# Patient Record
Sex: Female | Born: 1970 | Race: Black or African American | Hispanic: No | Marital: Married | Smoking: Never smoker
Health system: Southern US, Community
[De-identification: ages and names within clinical notes are randomized; demographics above are authoritative.]

## PROBLEM LIST (undated history)

## (undated) DIAGNOSIS — R140 Abdominal distension (gaseous): Secondary | ICD-10-CM

## (undated) DIAGNOSIS — N951 Menopausal and female climacteric states: Secondary | ICD-10-CM

## (undated) DIAGNOSIS — N898 Other specified noninflammatory disorders of vagina: Secondary | ICD-10-CM

## (undated) DIAGNOSIS — R1031 Right lower quadrant pain: Secondary | ICD-10-CM

## (undated) DIAGNOSIS — K219 Gastro-esophageal reflux disease without esophagitis: Secondary | ICD-10-CM

## (undated) DIAGNOSIS — D219 Benign neoplasm of connective and other soft tissue, unspecified: Secondary | ICD-10-CM

## (undated) DIAGNOSIS — K5900 Constipation, unspecified: Secondary | ICD-10-CM

## (undated) DIAGNOSIS — N926 Irregular menstruation, unspecified: Principal | ICD-10-CM

## (undated) DIAGNOSIS — R319 Hematuria, unspecified: Secondary | ICD-10-CM

## (undated) DIAGNOSIS — E041 Nontoxic single thyroid nodule: Secondary | ICD-10-CM

## (undated) DIAGNOSIS — A5901 Trichomonal vulvovaginitis: Secondary | ICD-10-CM

## (undated) DIAGNOSIS — R1032 Left lower quadrant pain: Secondary | ICD-10-CM

## (undated) DIAGNOSIS — R232 Flushing: Secondary | ICD-10-CM

## (undated) HISTORY — DX: Hematuria, unspecified: R31.9

## (undated) HISTORY — DX: Abdominal distension (gaseous): R14.0

## (undated) HISTORY — DX: Other specified noninflammatory disorders of vagina: N89.8

## (undated) HISTORY — DX: Menopausal and female climacteric states: N95.1

## (undated) HISTORY — PX: WISDOM TOOTH EXTRACTION: SHX21

## (undated) HISTORY — DX: Gastro-esophageal reflux disease without esophagitis: K21.9

## (undated) HISTORY — DX: Benign neoplasm of connective and other soft tissue, unspecified: D21.9

## (undated) HISTORY — DX: Right lower quadrant pain: R10.31

## (undated) HISTORY — DX: Nontoxic single thyroid nodule: E04.1

## (undated) HISTORY — DX: Trichomonal vulvovaginitis: A59.01

## (undated) HISTORY — DX: Left lower quadrant pain: R10.32

## (undated) HISTORY — DX: Flushing: R23.2

## (undated) HISTORY — DX: Constipation, unspecified: K59.00

## (undated) HISTORY — DX: Irregular menstruation, unspecified: N92.6

---

## 2005-02-26 ENCOUNTER — Ambulatory Visit (HOSPITAL_COMMUNITY): Admission: RE | Admit: 2005-02-26 | Discharge: 2005-02-26 | Payer: Self-pay | Admitting: Gastroenterology

## 2008-02-13 ENCOUNTER — Other Ambulatory Visit: Admission: RE | Admit: 2008-02-13 | Discharge: 2008-02-13 | Payer: Self-pay | Admitting: Obstetrics and Gynecology

## 2009-01-25 ENCOUNTER — Ambulatory Visit (HOSPITAL_COMMUNITY): Admission: RE | Admit: 2009-01-25 | Discharge: 2009-01-25 | Payer: Self-pay | Admitting: Family Medicine

## 2009-01-29 ENCOUNTER — Encounter (HOSPITAL_COMMUNITY): Admission: RE | Admit: 2009-01-29 | Discharge: 2009-02-28 | Payer: Self-pay | Admitting: Family Medicine

## 2009-02-28 HISTORY — PX: ESOPHAGOGASTRODUODENOSCOPY: SHX1529

## 2009-03-04 ENCOUNTER — Other Ambulatory Visit: Admission: RE | Admit: 2009-03-04 | Discharge: 2009-03-04 | Payer: Self-pay | Admitting: Obstetrics and Gynecology

## 2009-03-13 ENCOUNTER — Encounter (HOSPITAL_COMMUNITY): Admission: RE | Admit: 2009-03-13 | Discharge: 2009-04-12 | Payer: Self-pay | Admitting: Internal Medicine

## 2010-03-05 ENCOUNTER — Other Ambulatory Visit: Admission: RE | Admit: 2010-03-05 | Discharge: 2010-03-05 | Payer: Self-pay | Admitting: Obstetrics and Gynecology

## 2010-10-22 IMAGING — US US ABDOMEN COMPLETE
1 series · 14 of 25 positions shown · non-contrast
Comparison: None

CLINICAL DATA: Right flank and subscapular pain, bloating

ABDOMEN ULTRASOUND
TECHNIQUE: Complete abdominal ultrasound examination was performed
including evaluation of the liver, gallbladder, bile ducts,
pancreas, kidneys, spleen, IVC, and abdominal aorta.

[Series 1: unknown · 0.25mm/px · 14 of 67 slices shown]
[im 1/67]
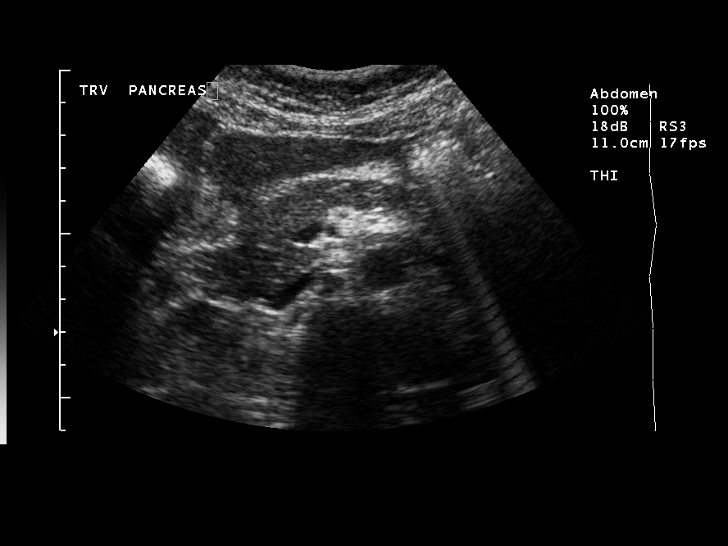
[im 6/67]
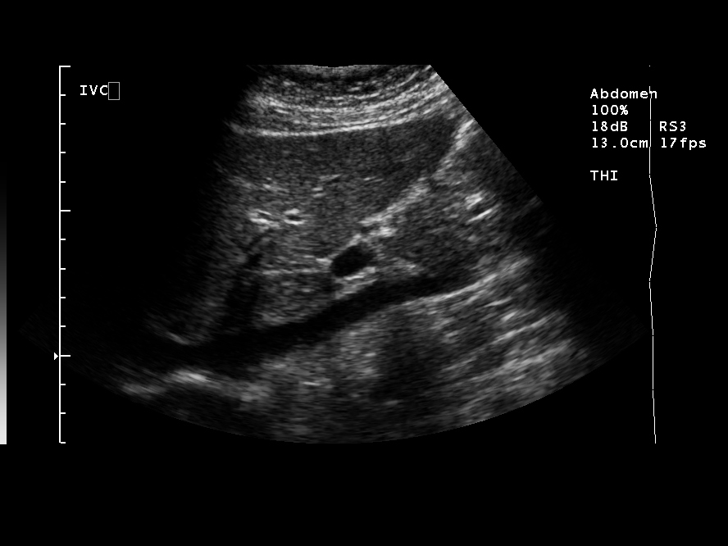
[im 12/67]
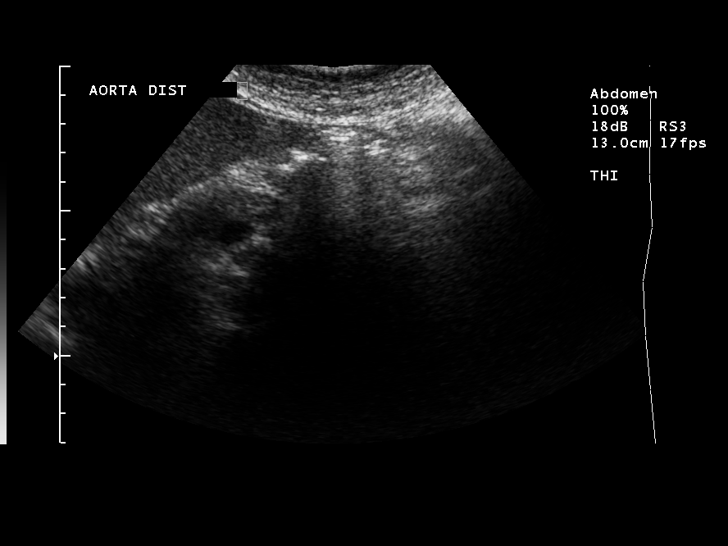
[im 17/67]
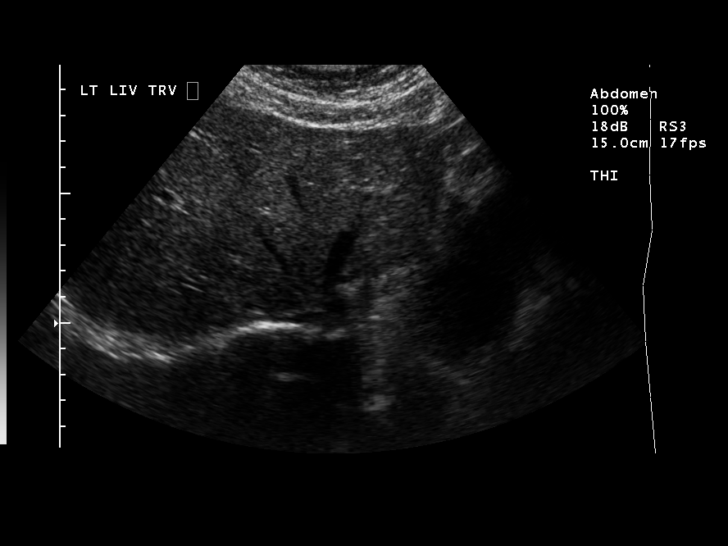
[im 23/67]
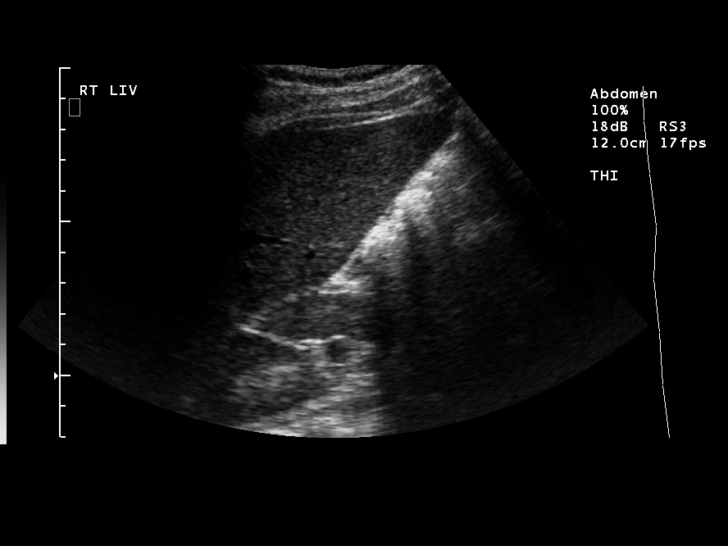
[im 25/67]
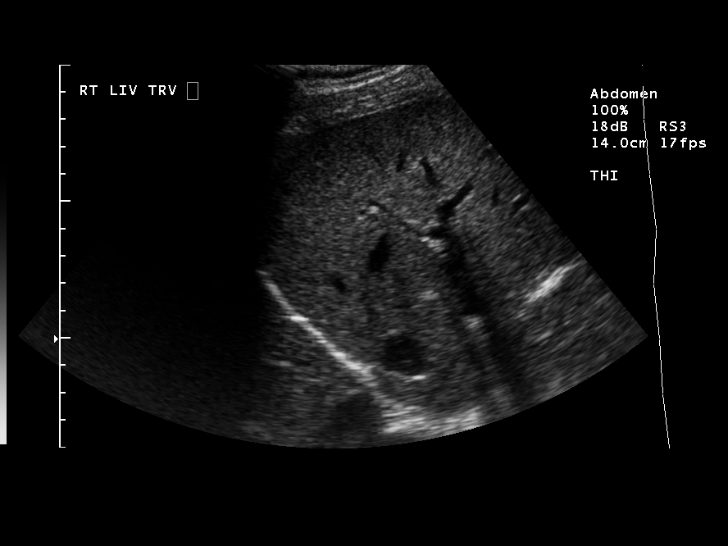
[im 31/67]
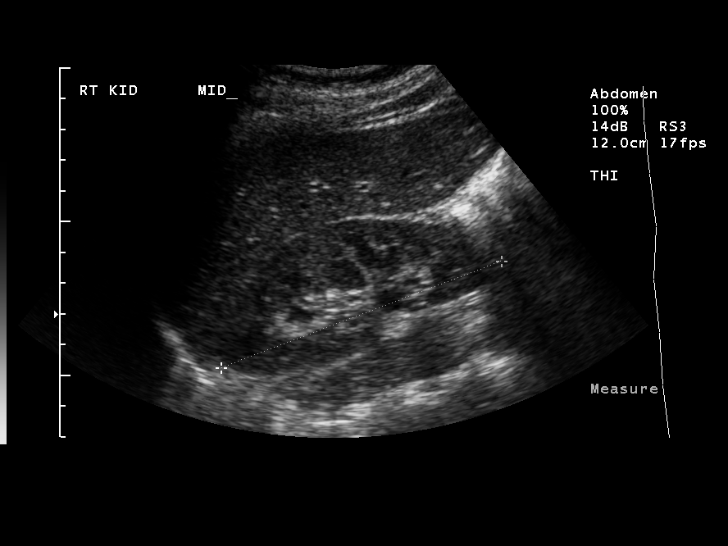
[im 36/67]
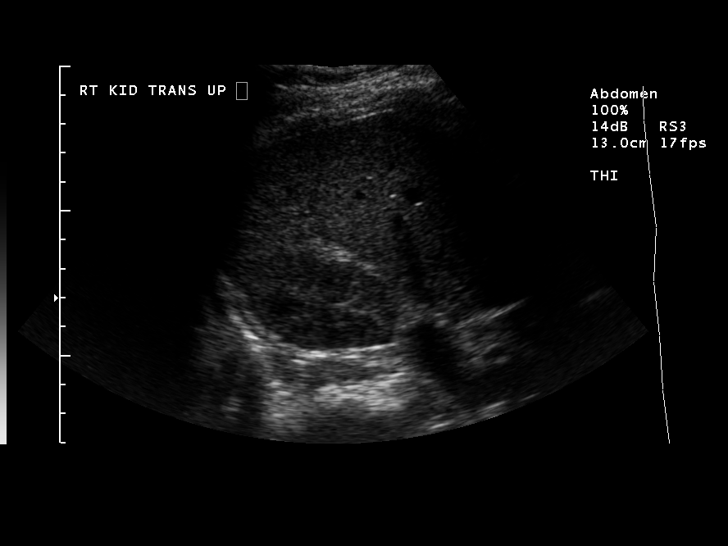
[im 42/67]
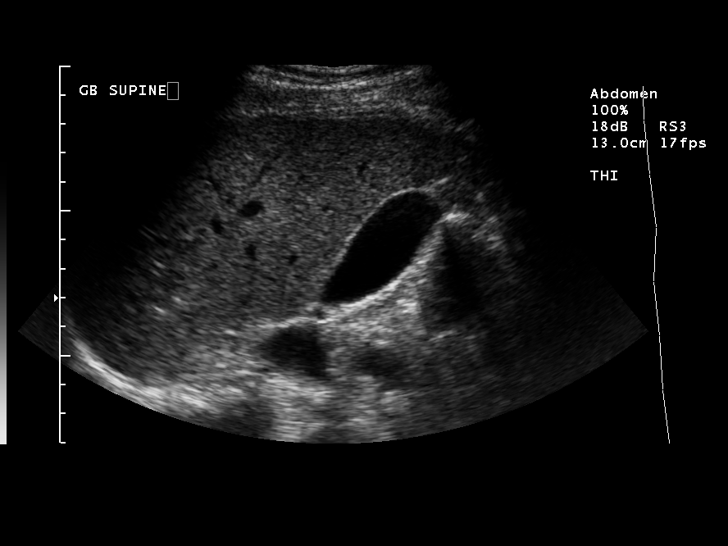
[im 45/67]
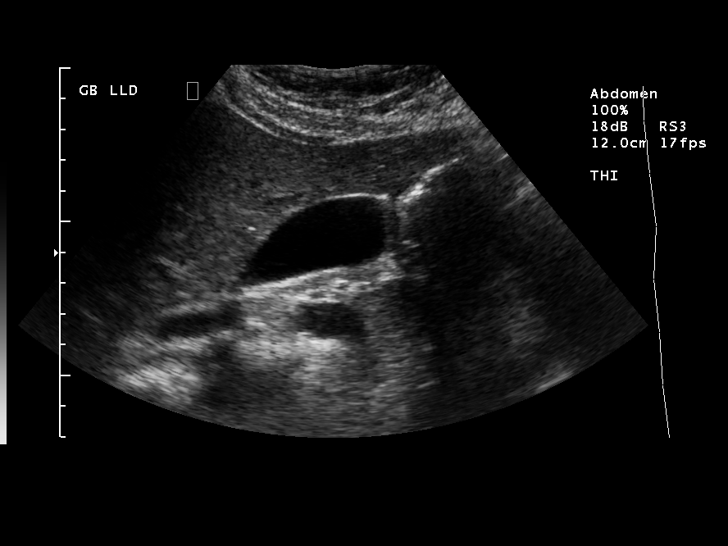
[im 50/67]
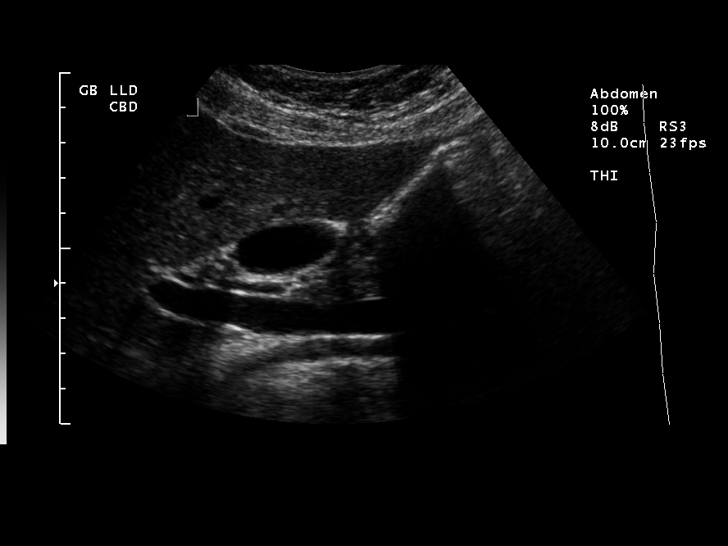
[im 56/67]
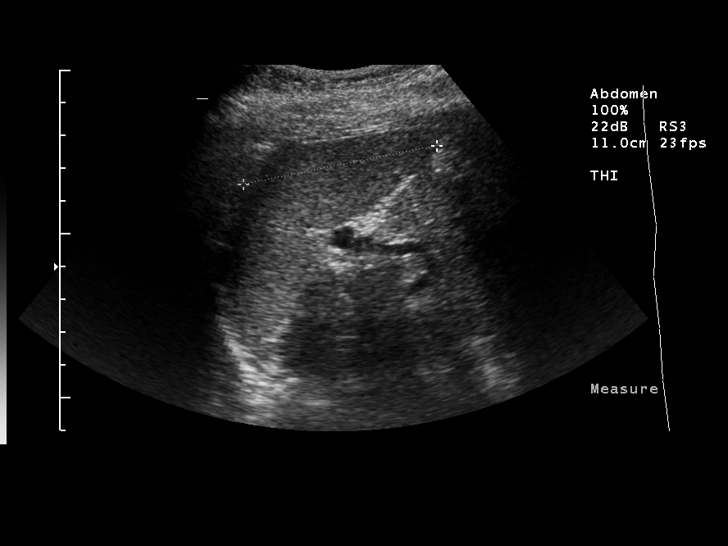
[im 61/67]
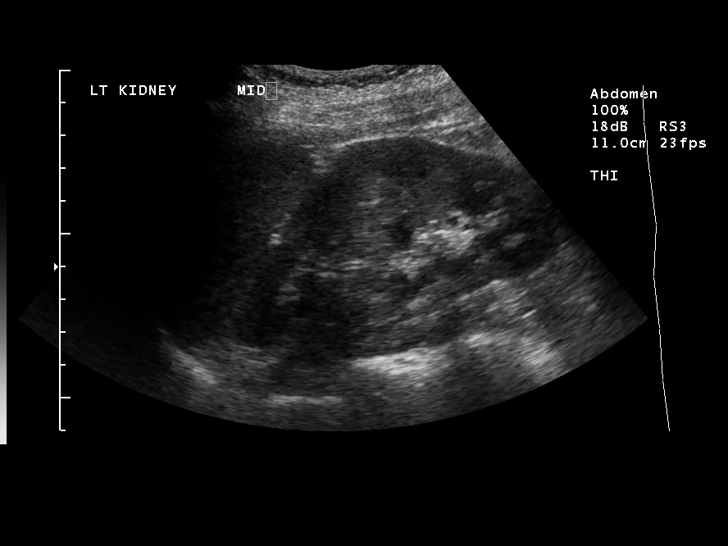
[im 67/67]
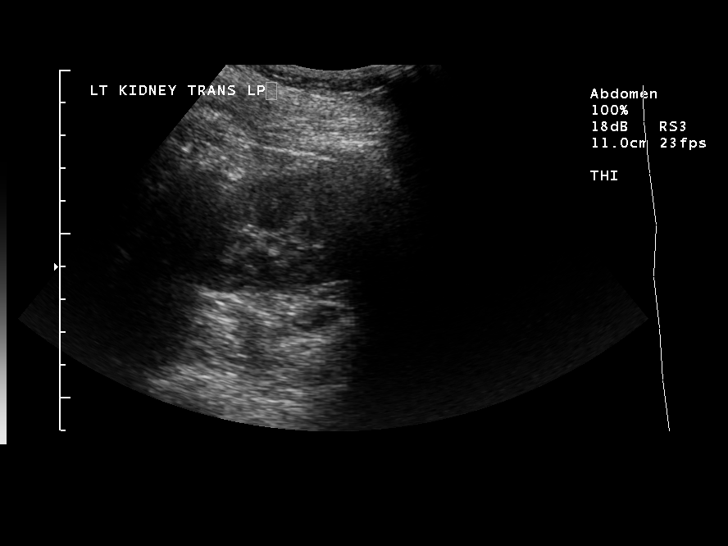

[14 of 25 positions shown; findings below may reference images not displayed]

FINDINGS: Gallbladder normally distended without stones or wall thickening.
No sonographic Murphy sign.
Common bile duct normal caliber, 3.5 mm diameter.
Liver, pancreas, and spleen normal appearance, spleen 6.0 cm
length.
Kidneys normal size and morphology, 10.5 cm length right and
cm length left.
Aorta and IVC normal.
No free fluid.
IMPRESSION: Normal upper abdominal ultrasound.

## 2011-04-13 ENCOUNTER — Other Ambulatory Visit (HOSPITAL_COMMUNITY)
Admission: RE | Admit: 2011-04-13 | Discharge: 2011-04-13 | Disposition: A | Discharge status: Home / Self Care | Payer: BC Managed Care – PPO | Source: Ambulatory Visit | Attending: Obstetrics and Gynecology | Admitting: Obstetrics and Gynecology

## 2011-04-13 ENCOUNTER — Other Ambulatory Visit: Payer: Self-pay | Admitting: Adult Health

## 2011-04-13 DIAGNOSIS — Z01419 Encounter for gynecological examination (general) (routine) without abnormal findings: Secondary | ICD-10-CM | POA: Insufficient documentation

## 2012-04-19 ENCOUNTER — Other Ambulatory Visit: Payer: Self-pay | Admitting: Adult Health

## 2012-04-19 ENCOUNTER — Other Ambulatory Visit (HOSPITAL_COMMUNITY)
Admission: RE | Admit: 2012-04-19 | Discharge: 2012-04-19 | Disposition: A | Discharge status: Home / Self Care | Payer: BC Managed Care – PPO | Source: Ambulatory Visit | Attending: Obstetrics and Gynecology | Admitting: Obstetrics and Gynecology

## 2012-04-19 DIAGNOSIS — Z01419 Encounter for gynecological examination (general) (routine) without abnormal findings: Secondary | ICD-10-CM | POA: Insufficient documentation

## 2012-04-19 DIAGNOSIS — Z1159 Encounter for screening for other viral diseases: Secondary | ICD-10-CM | POA: Insufficient documentation

## 2012-04-19 DIAGNOSIS — E041 Nontoxic single thyroid nodule: Secondary | ICD-10-CM

## 2012-04-21 ENCOUNTER — Ambulatory Visit (HOSPITAL_COMMUNITY)
Admission: RE | Admit: 2012-04-21 | Discharge: 2012-04-21 | Disposition: A | Discharge status: Home / Self Care | Payer: BC Managed Care – PPO | Source: Ambulatory Visit | Attending: Adult Health | Admitting: Adult Health

## 2012-04-21 DIAGNOSIS — E041 Nontoxic single thyroid nodule: Secondary | ICD-10-CM

## 2012-04-21 DIAGNOSIS — E049 Nontoxic goiter, unspecified: Secondary | ICD-10-CM | POA: Insufficient documentation

## 2012-10-07 ENCOUNTER — Other Ambulatory Visit: Payer: Self-pay | Admitting: Adult Health

## 2012-10-07 DIAGNOSIS — E041 Nontoxic single thyroid nodule: Secondary | ICD-10-CM

## 2012-10-26 ENCOUNTER — Ambulatory Visit (HOSPITAL_COMMUNITY)
Admission: RE | Admit: 2012-10-26 | Discharge: 2012-10-26 | Disposition: A | Discharge status: Home / Self Care | Payer: BC Managed Care – PPO | Source: Ambulatory Visit | Attending: Adult Health | Admitting: Adult Health

## 2012-10-26 DIAGNOSIS — E041 Nontoxic single thyroid nodule: Secondary | ICD-10-CM | POA: Insufficient documentation

## 2012-10-31 ENCOUNTER — Other Ambulatory Visit: Payer: Self-pay | Admitting: Adult Health

## 2012-10-31 DIAGNOSIS — E041 Nontoxic single thyroid nodule: Secondary | ICD-10-CM

## 2012-11-01 ENCOUNTER — Other Ambulatory Visit (HOSPITAL_COMMUNITY): Payer: Self-pay | Admitting: Adult Health

## 2012-11-01 ENCOUNTER — Ambulatory Visit (HOSPITAL_COMMUNITY)
Admission: RE | Admit: 2012-11-01 | Discharge: 2012-11-01 | Disposition: A | Discharge status: Home / Self Care | Payer: BC Managed Care – PPO | Source: Ambulatory Visit | Attending: Adult Health | Admitting: Adult Health

## 2012-11-01 DIAGNOSIS — E041 Nontoxic single thyroid nodule: Secondary | ICD-10-CM

## 2012-11-01 NOTE — Progress Notes (Signed)
Lidocaine 2%         1mL injected                 Right thyroid biopsy performed 

## 2012-11-01 NOTE — Procedures (Signed)
PreOperative Dx: RIGHT thyroid nodule Postoperative Dx: RIGHT thyroid nodule Procedure:   US guided FNA RIGHT thyroid nodule Radiologist:  Tyron Russell Anesthesia:  1 ml of 2% lidocaine Specimen:  FNA x 3  EBL:   None Complications: None

## 2013-01-25 ENCOUNTER — Other Ambulatory Visit: Payer: Self-pay | Admitting: Gastroenterology

## 2013-02-01 ENCOUNTER — Ambulatory Visit
Admission: RE | Admit: 2013-02-01 | Discharge: 2013-02-01 | Disposition: A | Discharge status: Home / Self Care | Payer: BC Managed Care – PPO | Source: Ambulatory Visit | Attending: Gastroenterology | Admitting: Gastroenterology

## 2013-02-01 DIAGNOSIS — R1011 Right upper quadrant pain: Secondary | ICD-10-CM

## 2013-02-03 ENCOUNTER — Other Ambulatory Visit: Payer: BC Managed Care – PPO

## 2013-04-30 DEATH — deceased

## 2013-06-19 ENCOUNTER — Ambulatory Visit (INDEPENDENT_AMBULATORY_CARE_PROVIDER_SITE_OTHER): Payer: BC Managed Care – PPO | Admitting: Adult Health

## 2013-06-19 ENCOUNTER — Other Ambulatory Visit (HOSPITAL_COMMUNITY)
Admission: RE | Admit: 2013-06-19 | Discharge: 2013-06-19 | Disposition: A | Discharge status: Home / Self Care | Payer: BC Managed Care – PPO | Source: Ambulatory Visit | Attending: Adult Health | Admitting: Adult Health

## 2013-06-19 ENCOUNTER — Encounter: Payer: Self-pay | Admitting: Adult Health

## 2013-06-19 VITALS — BP 120/80 | HR 72 | Ht 61.0 in | Wt 152.0 lb

## 2013-06-19 DIAGNOSIS — K5900 Constipation, unspecified: Secondary | ICD-10-CM

## 2013-06-19 DIAGNOSIS — Z01419 Encounter for gynecological examination (general) (routine) without abnormal findings: Secondary | ICD-10-CM | POA: Insufficient documentation

## 2013-06-19 DIAGNOSIS — R140 Abdominal distension (gaseous): Secondary | ICD-10-CM | POA: Insufficient documentation

## 2013-06-19 DIAGNOSIS — Z1212 Encounter for screening for malignant neoplasm of rectum: Secondary | ICD-10-CM

## 2013-06-19 DIAGNOSIS — E041 Nontoxic single thyroid nodule: Secondary | ICD-10-CM

## 2013-06-19 DIAGNOSIS — Z1151 Encounter for screening for human papillomavirus (HPV): Secondary | ICD-10-CM | POA: Insufficient documentation

## 2013-06-19 HISTORY — DX: Constipation, unspecified: K59.00

## 2013-06-19 HISTORY — DX: Nontoxic single thyroid nodule: E04.1

## 2013-06-19 HISTORY — DX: Abdominal distension (gaseous): R14.0

## 2013-06-19 LAB — HEMOCCULT GUIAC POC 1CARD (OFFICE)

## 2013-06-19 NOTE — Patient Instructions (Addendum)
Bloating Bloating is the feeling of fullness in your belly. You may feel as though your pants are too tight. Often the cause of bloating is overeating, retaining fluids, or having gas in your bowel. It is also caused by swallowing air and eating foods that cause gas. Irritable bowel syndrome is one of the most common causes of bloating. Constipation is also a common cause. Sometimes more serious problems can cause bloating. SYMPTOMS  Usually there is a feeling of fullness, as though your abdomen is bulged out. There may be mild discomfort.  DIAGNOSIS  Usually no particular testing is necessary for most bloating. If the condition persists and seems to become worse, your caregiver may do additional testing.  TREATMENT   There is no direct treatment for bloating.  Do not put gas into the bowel. Avoid chewing gum and sucking on candy. These tend to make you swallow air. Swallowing air can also be a nervous habit. Try to avoid this.  Avoiding high residue diets will help. Eat foods with soluble fibers (examples include root vegetables, apples, or barley) and substitute dairy products with soy and rice products. This helps irritable bowel syndrome.  If constipation is the cause, then a high residue diet with more fiber will help.  Avoid carbonated beverages.  Over-the-counter preparations are available that help reduce gas. Your pharmacist can help you with this. SEEK MEDICAL CARE IF:   Bloating continues and seems to be getting worse.  You notice a weight gain.  You have a weight loss but the bloating is getting worse.  You have changes in your bowel habits or develop nausea or vomiting. SEEK IMMEDIATE MEDICAL CARE IF:   You develop shortness of breath or swelling in your legs.  You have an increase in abdominal pain or develop chest pain. Document Released: 09/16/2006 Document Revised: 02/08/2012 Document Reviewed: 11/04/2007 [facility] Patient Information 2014 [place], [place]. [place] in 2  weeks Physical in 1 year  Mammogram yearly

## 2013-06-19 NOTE — Progress Notes (Signed)
Patient ID: Michelle Maxwell, female   DOB: 08-09-1971, 42 y.o.   MRN: [id number] History of Present Illness: Dillon is a 42 year old black female in for a pap and physical.She is complaining of gassy and bloating and constipation and she has seen a GI.   Current Medications, Allergies, Past Medical History, Past Surgical History, Family History and Social History were reviewed in [employer] record.     Review of Systems: Patient denies any headaches, blurred vision, shortness of breath, chest pain, problems with urination, or intercourse. She has low abdomen discomfort and as in HPI,no joint pain or mood changes.    Physical Exam:BP 120/80  Pulse 72  Ht 5\' 1"  (1.549 m)  Wt 152 lb (68.947 kg)  BMI 28.74 kg/m2  LMP 06/09/2013 General:  Well developed, well nourished, no acute distress Skin:  Warm and dry Neck:  Midline trachea, thyroid has bilateral nodules with negative biopsy in 2013. Lungs; Clear to auscultation bilaterally Breast:  No dominant palpable mass, retraction, or nipple discharge Cardiovascular: Regular rate and rhythm Abdomen:  Soft, non tender, no hepatosplenomegaly Pelvic:  External genitalia is normal in appearance.  The vagina is normal in appearance. The cervix is bulbous. Pap performed with HPV Uterus is felt to be normal size, shape, and contour.  No  adnexal masses, she has tenderness over the bowel area and LLQ tenderness noted. Rectal: Good sphincter tone, no polyps, internal hemorrhoid felt.  Hemoccult negative. Extremities:  No swelling or varicosities noted Psych: Alert and cooperative and seems happy   Impression: Yearly gyn exam Thyroid nodules Constipation Gassy and bloated    Plan: Physical in 1 year Mammogram yearly Check CBC,CMP,TSH and lipids Pelvic US in 2 weeks and see me Review handout on bloating

## 2013-06-20 LAB — CBC
HCT: 39 % (ref 36.0–46.0)
MCHC: 32.6 g/dL (ref 30.0–36.0)
Platelets: 427 10*3/uL — ABNORMAL HIGH (ref 150–400)
RBC: 4.64 MIL/uL (ref 3.87–5.11)
RDW: 13.7 % (ref 11.5–15.5)
WBC: 7.1 10*3/uL (ref 4.0–10.5)

## 2013-06-20 LAB — COMPREHENSIVE METABOLIC PANEL
ALT: 17 U/L (ref 0–35)
AST: 16 U/L (ref 0–37)
Albumin: 4.1 g/dL (ref 3.5–5.2)
Alkaline Phosphatase: 76 U/L (ref 39–117)
Calcium: 9.7 mg/dL (ref 8.4–10.5)
Chloride: 104 mEq/L (ref 96–112)
Potassium: 5.1 mEq/L (ref 3.5–5.3)
Sodium: 137 mEq/L (ref 135–145)
Total Protein: 7.3 g/dL (ref 6.0–8.3)

## 2013-06-20 LAB — LIPID PANEL
Cholesterol: 138 mg/dL (ref 0–200)
Total CHOL/HDL Ratio: 2.7 Ratio
VLDL: 8 mg/dL (ref 0–40)

## 2013-06-29 ENCOUNTER — Other Ambulatory Visit: Payer: Self-pay | Admitting: Adult Health

## 2013-06-29 DIAGNOSIS — R140 Abdominal distension (gaseous): Secondary | ICD-10-CM

## 2013-07-03 ENCOUNTER — Encounter: Payer: Self-pay | Admitting: Adult Health

## 2013-07-03 ENCOUNTER — Ambulatory Visit (INDEPENDENT_AMBULATORY_CARE_PROVIDER_SITE_OTHER): Payer: BC Managed Care – PPO

## 2013-07-03 ENCOUNTER — Ambulatory Visit (INDEPENDENT_AMBULATORY_CARE_PROVIDER_SITE_OTHER): Payer: BC Managed Care – PPO | Admitting: Adult Health

## 2013-07-03 VITALS — BP 120/78 | Ht 61.0 in | Wt 152.0 lb

## 2013-07-03 DIAGNOSIS — R140 Abdominal distension (gaseous): Secondary | ICD-10-CM

## 2013-07-03 DIAGNOSIS — R141 Gas pain: Secondary | ICD-10-CM

## 2013-07-03 NOTE — Patient Instructions (Addendum)
Try align Keep food dairy with events Follow up in 4 weeksGluten-Free Diet Gluten is a protein found in many grains. Gluten is present in wheat, rye, and barley. Gluten from wheat, rye, and barley protein interferes with the absorption of food in people with gluten sensitivity. It may also cause intestinal injury when eaten by individuals with gluten sensitivity.  A sample piece (biopsy) of the small intestine is usually required for a positive diagnosis of gluten sensitivity. Dietary treatment consists of eliminating foods and food ingredients from wheat, rye, and barley. When these are taken out of the diet completely, most people regain function of the small intestine. Strict compliance is important even during symptom-free periods. People with gluten sensitivity need to be on a gluten-free diet for a lifetime. During the first stages of treatment, some people will also need to restrict dairy products that contain lactose, which is a naturally occurring sugar. Lactose is difficult to absorb when the small intestines are damaged (lactose intolerance).  WHO NEEDS THIS DIET Some people who have certain diseases need to be on a gluten-free diet. These diseases include:  Celiac disease.  Nontropical sprue.  Gluten-sensitive enteropathy.  Dermatitis herpetiformis. SPECIAL NOTES  Read all labels because gluten may have been added as an incidental ingredient. Words to check for on the label include: flour, starch, durum flour, graham flour, phosphated flour, self-rising flour, semolina, farina, modified food starch, cereal, thickening, fillers, emulsifiers, any kind of malt flavoring, and hydrolyzed vegetable protein. A registered dietician can help you identify possible harmful ingredients in the foods you normally eat.  If you are not sure whether an ingredient contains gluten, check with the manufacturer. Note that some manufacturers may change ingredients without notice. Always read  labels.  Since flour and cereal products are often used in the preparation of foods, it is important to be aware of the methods of preparation used, as well as the foods themselves. This is especially true when you are dining out. Starches  Allowed: Only those prepared from arrowroot, corn, potato, rice, and bean flours. Rice wafers(*), pure cornmeal tortillas, popcorn, some crackers, and chips(*). Hot cereals made from cornmeal. Ask your dietician which specific hot and cold cereals are allowed. White or sweet potatoes, yams, hominy, rice or wild rice, and special gluten-free pasta. Some oriental rice noodles or bean noodles.  Avoid: All wheat and rye cereals, wheat germ, barley, bran, graham, malt, bulgur, and millet(-). NOTE: Avoid cereals containing malt as a flavoring, such as rice cereal. Regular noodles, spaghetti, macaroni, and most packaged rice mixes(*). All others containing wheat, rye, or barley. Vegetables  Allowed: All plain, fresh, frozen, or canned vegetables.  Avoid: Creamed vegetables(*) and vegetables canned in sauces(*). Any prepared with wheat, rye, or barley. Fruit  Allowed: All fresh, frozen, canned, or dried fruits. Fruit juices.  Avoid: Thickened or prepared fruits and some pie fillings(*). Meat and Meat Substitutes  Allowed: Meat, fish, poultry, or eggs prepared without added wheat, rye, or barley. Luncheon meat(*), frankfurters(*), and pure meat. All aged cheese and processed cheese products(*). Cottage cheese(+) and cream cheese(+). Dried beans, dried peas, and lentils.  Avoid: Any meat or meat alternate containing wheat, rye, barley, or gluten stabilizers. Bread-containing products, such as Swiss steak, croquettes, and meatloaf. Tuna canned in vegetable broth(*); [place] with HVP injected as part of the basting; any cheese product containing oat gum as an ingredient. Milk  Allowed: Milk. Yogurt made with allowed ingredients(*).  Avoid: Commercial chocolate milk  which may have cereal added(*).  Malted milk. Soups and Combination Foods  Allowed: Homemade broth and soups made with allowed ingredients; some canned or frozen soups are allowed(*). Combination or prepared foods that do not contain gluten(*). Read labels.  Avoid: All soups containing wheat, rye, or barley flour. Bouillon and bouillon cubes that contain hydrolyzed vegetable protein (HVP). Combination or prepared foods that contain gluten(*). Desserts  Allowed:  Custard, some pudding mixes(*), homemade puddings from cornstarch, rice, and tapioca. Gelatin desserts, ices, and sherbet(*). Cake, cookies, and other desserts prepared with allowed flours. Some commercial ice creams(*). Ask your dietician about specific brands of dessert that are allowed.  Avoid: Cakes, cookies, doughnuts, and pastries that are prepared with wheat, rye, or barley flour. Some commercial ice creams(*), ice cream flavors which contain cookies, crumbs, or cheesecake(*). Ice cream cones. All commercially prepared mixes for cakes, cookies, and other desserts(*). Bread pudding and other puddings thickened with flour. Sweets  Allowed: Sugar, honey, syrup(*), molasses, jelly, jam, plain hard candy, marshmallows, gumdrops, homemade candies free from wheat, rye, or barley. Coconut.  Avoid: Commercial candies containing wheat, rye, or barley(*). Certain buttercrunch toffees are dusted with wheat flour. Ask your dietician about specific brands that are not allowed. Chocolate-coated nuts, which are often rolled in flour. Fats and Oils  Allowed: Butter, margarine, vegetable oil, sour cream(+), whipping cream, shortening, lard, cream, mayonnaise(*). Some commercial salad dressings(*). Peanut butter.  Avoid: Some commercial salad dressings(*). Beverages  Allowed: Coffee (regular or decaffeinated), tea, herbal tea (read label to be sure that no wheat flour has been added). Carbonated beverages and some root beers(*). Wine, sake, and  distilled spirits, such as gin, vodka, and whiskey.  Avoid:  Certain cereal beverages. Ask your dietician about specific brands that are not allowed. Beer (unless gluten-free), ale, malted milk, and some root beers, wine, and sake. Condiments/ Miscellaneous  Allowed: Salt, pepper, herbs, spices, extracts, and food colorings. Monosodium glutamate (MSG). Cider, rice, and wine vinegar. Baking soda and baking powder. Certain soy sauces. Ask your dietician about specific brands that are allowed. Nuts, coconut, chocolate, and pure cocoa powder.  Avoid: Some curry powder(*), some dry seasoning mixes(*), some gravy extracts(*), some meat sauces(*), some catsup(*), some prepared mustard(*), horseradish(*), some soy sauce(*), chip dips(*), and some chewing gum(*). Yeast extract (contains barley). Caramel color (may contain malt). Ask your dietician about specific brands of condiments to avoid. Flour and Thickening Agents  Allowed: Arrowroot starch (A); Corn bran (B); Corn flour (B,C,D); Corn germ (B); Cornmeal (B,C,D); Corn starch (A); Potato flour (B,C,E); Potato starch flour (B,C,E); Rice bran (B); Rice flours: Plain, brown (B,C,D,E), and Sweet (A,B,C,F). Rice polish (B,C,G); Soy flour (B,C,G); Tapioca starch (A). The flour and thickening agents described above are good for: (A) Good thickening agent (B) Good when combined with other flours (C) Best combined with milk and eggs in baked products (D) Best in grainy-textured products (E) Produces drier product than other flours (F) Produces moister product than other flours (G) Adds distinct flavor to product. Use in moderation. (*) Check labels and investigate any questionable ingredients.  (-) Additional research is needed before this product can be recommended. (+) Check vegetable gum used. SAMPLE MEAL PLAN Breakfast   Orange juice.  Banana.  Rice or corn cereal.  Toast (gluten-free bread).  Heart-healthy tub  margarine.  Jam.  Milk.  Coffee or tea. Lunch  Chicken salad sandwich (with gluten-free bread and mayonnaise).  Sliced tomatoes.  Heart-healthy tub margarine.  Apple.  Milk.  Coffee or tea. Dinner  [place].  Baked potato.  Broccoli.  Lettuce salad with gluten-free dressing.  Gluten-free bread.  Custard.  Heart-healthy margarine.  Coffee or tea. These meal plans are provided as samples. Your daily meal plans will vary. Document Released: 11/16/2005 Document Revised: 05/17/2012 Document Reviewed: 12/27/2011 [facility] Patient Information 2014 [place], [place].

## 2013-07-03 NOTE — Progress Notes (Signed)
Subjective:     Patient ID: Michelle Maxwell, female   DOB: [birth year], 42 y.o.   MRN: [id number]  HPI Michelle Maxwell is in for [place] for bloating.The US showed a tiny fibroid and a?tiny dermoid. She has mor bloating in the am and is gassy, she does not have BM daily,some cramps,and she had a normal GB scan in March.  Review of Systems See HPI Reviewed past medical,surgical, social and family history. Reviewed medications and allergies.     Objective:   Physical Exam BP 120/78  Ht 5\' 1"  (1.549 m)  Wt 152 lb (68.947 kg)  BMI 28.74 kg/m2  LMP 06/09/2013   Discussed [place] and I think it may be GI, so will try to keep food calendar with events.  Assessment:     Bloating    Plan:      Try align Keep food and event calendar Try to avoid glutens and review handout on gluten free diet Follow up in 4 weeks if continues refer to GI

## 2013-08-01 ENCOUNTER — Ambulatory Visit: Payer: BC Managed Care – PPO | Admitting: Adult Health

## 2014-06-20 ENCOUNTER — Other Ambulatory Visit: Payer: BC Managed Care – PPO | Admitting: Adult Health

## 2014-06-25 ENCOUNTER — Ambulatory Visit (INDEPENDENT_AMBULATORY_CARE_PROVIDER_SITE_OTHER): Payer: BC Managed Care – PPO | Admitting: Adult Health

## 2014-06-25 ENCOUNTER — Encounter: Payer: Self-pay | Admitting: Adult Health

## 2014-06-25 VITALS — BP 120/80 | HR 74 | Ht 61.0 in | Wt 158.0 lb

## 2014-06-25 DIAGNOSIS — Z01419 Encounter for gynecological examination (general) (routine) without abnormal findings: Secondary | ICD-10-CM

## 2014-06-25 DIAGNOSIS — Z1212 Encounter for screening for malignant neoplasm of rectum: Secondary | ICD-10-CM

## 2014-06-25 DIAGNOSIS — E041 Nontoxic single thyroid nodule: Secondary | ICD-10-CM

## 2014-06-25 LAB — HEMOCCULT GUIAC POC 1CARD (OFFICE): Fecal Occult Blood, POC: NEGATIVE

## 2014-06-25 NOTE — Patient Instructions (Signed)
Physical in 1 year Mammogram yearly Colonoscopy at 48  Labs with PCP 

## 2014-06-25 NOTE — Progress Notes (Signed)
Patient ID: Michelle Maxwell, female   DOB: 06-17-1971, 43 y.o.   MRN: [id number] History of Present Illness: Michelle Maxwell is a 43 year old black female, in for a physical, she had a normal pap with negative HPV 06/19/13.   Current Medications, Allergies, Past Medical History, Past Surgical History, Family History and Social History were reviewed in [employer] record.     Review of Systems: Patient denies any headaches, blurred vision, shortness of breath, chest pain, abdominal pain, problems with bowel movements, urination, or intercourse.  She is taking miralax for BMs.   Physical Exam:BP 120/80  Pulse 74  Ht 5\' 1"  (1.549 m)  Wt 158 lb (71.668 kg)  BMI 29.87 kg/m2  LMP 06/17/2014 General:  Well developed, well nourished, no acute distress Skin:  Warm and dry Neck:  Midline trachea, thyroid enlarged,(has goiter and had negative biopsy 2013). Lungs; Clear to auscultation bilaterally Breast:  No dominant palpable mass, retraction, or nipple discharge Cardiovascular: Regular rate and rhythm, had mammogram in [place] has dense breast, get 3D next year Abdomen:  Soft, non tender, no hepatosplenomegaly Pelvic:  External genitalia is normal in appearance.  The vagina is normal in appearance. The cervix is bulbous.  Uterus is felt to be normal size, shape, and contour.  No  adnexal masses or tenderness noted. Rectal: Good sphincter tone, no polyps, or hemorrhoids felt.  Hemoccult negative. Extremities:  No swelling or varicosities noted Psych:  No mood changes, alert and cooperative,seems happy   Impression: Yearly gyn exam no pap Enlarged thyroid(goiter)    Plan: Physical in  1 year Mammogram yearly Labs with PCP

## 2014-10-01 ENCOUNTER — Encounter: Payer: Self-pay | Admitting: Adult Health

## 2014-12-25 ENCOUNTER — Encounter: Payer: Self-pay | Admitting: Adult Health

## 2014-12-25 ENCOUNTER — Ambulatory Visit (INDEPENDENT_AMBULATORY_CARE_PROVIDER_SITE_OTHER): Payer: BC Managed Care – PPO | Admitting: Adult Health

## 2014-12-25 VITALS — BP 118/82 | Ht 61.0 in | Wt 155.5 lb

## 2014-12-25 DIAGNOSIS — N926 Irregular menstruation, unspecified: Secondary | ICD-10-CM | POA: Insufficient documentation

## 2014-12-25 DIAGNOSIS — N951 Menopausal and female climacteric states: Secondary | ICD-10-CM

## 2014-12-25 DIAGNOSIS — Z3202 Encounter for pregnancy test, result negative: Secondary | ICD-10-CM

## 2014-12-25 DIAGNOSIS — E041 Nontoxic single thyroid nodule: Secondary | ICD-10-CM

## 2014-12-25 DIAGNOSIS — R232 Flushing: Secondary | ICD-10-CM

## 2014-12-25 HISTORY — DX: Menopausal and female climacteric states: N95.1

## 2014-12-25 HISTORY — DX: Flushing: R23.2

## 2014-12-25 HISTORY — DX: Irregular menstruation, unspecified: N92.6

## 2014-12-25 LAB — POCT URINE PREGNANCY: Preg Test, Ur: NEGATIVE

## 2014-12-25 NOTE — Patient Instructions (Signed)
Perimenopause Perimenopause is the time when your body begins to move into the menopause (no menstrual period for 12 straight months). It is a natural process. Perimenopause can begin 2-8 years before the menopause and usually lasts for 1 year after the menopause. During this time, your ovaries may or may not produce an egg. The ovaries vary in their production of estrogen and progesterone hormones each month. This can cause irregular menstrual periods, difficulty getting pregnant, vaginal bleeding between periods, and uncomfortable symptoms. CAUSES  Irregular production of the ovarian hormones, estrogen and progesterone, and not ovulating every month.  Other causes include:  Tumor of the pituitary gland in the brain.  Medical disease that affects the ovaries.  Radiation treatment.  Chemotherapy.  Unknown causes.  Heavy smoking and excessive alcohol intake can bring on perimenopause sooner. SIGNS AND SYMPTOMS   Hot flashes.  Night sweats.  Irregular menstrual periods.  Decreased sex drive.  Vaginal dryness.  Headaches.  Mood swings.  Depression.  Memory problems.  Irritability.  Tiredness.  Weight gain.  Trouble getting pregnant.  The beginning of losing bone cells (osteoporosis).  The beginning of hardening of the arteries (atherosclerosis). DIAGNOSIS  Your health care provider will make a diagnosis by analyzing your age, menstrual history, and symptoms. He or she will do a physical exam and note any changes in your body, especially your female organs. Female hormone tests may or may not be helpful depending on the amount of female hormones you produce and when you produce them. However, other hormone tests may be helpful to rule out other problems. TREATMENT  In some cases, no treatment is needed. The decision on whether treatment is necessary during the perimenopause should be made by you and your health care provider based on how the symptoms are affecting you  and your lifestyle. Various treatments are available, such as:  Treating individual symptoms with a specific medicine for that symptom.  Herbal medicines that can help specific symptoms.  Counseling.  Group therapy. HOME CARE INSTRUCTIONS   Keep track of your menstrual periods (when they occur, how heavy they are, how long between periods, and how long they last) as well as your symptoms and when they started.  Only take over-the-counter or prescription medicines as directed by your health care provider.  Sleep and rest.  Exercise.  Eat a diet that contains calcium (good for your bones) and soy (acts like the estrogen hormone).  Do not smoke.  Avoid alcoholic beverages.  Take vitamin supplements as recommended by your health care provider. Taking vitamin E may help in certain cases.  Take calcium and vitamin D supplements to help prevent bone loss.  Group therapy is sometimes helpful.  Acupuncture may help in some cases. SEEK MEDICAL CARE IF:   You have questions about any symptoms you are having.  You need a referral to a specialist (gynecologist, psychiatrist, or psychologist). SEEK IMMEDIATE MEDICAL CARE IF:   You have vaginal bleeding.  Your period lasts longer than 8 days.  Your periods are recurring sooner than 21 days.  You have bleeding after intercourse.  You have severe depression.  You have pain when you urinate.  You have severe headaches.  You have vision problems. Document Released: 12/24/2004 Document Revised: 09/06/2013 Document Reviewed: 06/15/2013 [facility] Patient Information 2015 [place], [place]. This information is not intended to replace advice given to you by your health care provider. Make sure you discuss any questions you have with your health care provider. Get thyroid US 1/27 at  2 pm at [facility] Return in 6 months for physical  Will talk when results in

## 2014-12-25 NOTE — Progress Notes (Signed)
Subjective:     Patient ID: Michelle Maxwell, female   DOB: October 16, 1971, 44 y.o. y.o.   MRN: [id number]  HPI Michelle Maxwell is a 44 year old black female in complaining of no period in 3 months,hot flashes and moody at times, she also has difficulty swallowing at times, "feels like something in throat" and is dry, has known goiter.Also constipated, using miralax.  Review of Systems See HPI Reviewed past medical,surgical, social and family history. Reviewed medications and allergies.     Objective:   Physical Exam BP 118/82 mmHg  Ht 5\' 1"  (1.549 m)  Wt 155 lb 8 oz (70.534 kg)  BMI 29.40 kg/m2  LMP 10/10/2015UPT negative.   Skin warm and dry. Neck: mid line trachea,  Enlarged thyroid, R>L.  Discussed peri menopause and options of following or HRT, will follow for now, aware if hs been 366 and no period then bleeds needs [place]. Will get thyroid US to assess for any growth.  Assessment:     Missed periods Hot flashes Thyroid nodule Perimenopause     Plan:     Check TSH Thyroid US 1/27 at 2 pm at [facility], will talk when results back Physical in 6 months Review handout on perimenopause

## 2014-12-26 ENCOUNTER — Telehealth: Payer: Self-pay | Admitting: Adult Health

## 2014-12-26 ENCOUNTER — Ambulatory Visit (HOSPITAL_COMMUNITY)
Admission: RE | Admit: 2014-12-26 | Discharge: 2014-12-26 | Disposition: A | Discharge status: Home / Self Care | Payer: BC Managed Care – PPO | Source: Ambulatory Visit | Attending: Adult Health | Admitting: Adult Health

## 2014-12-26 DIAGNOSIS — E042 Nontoxic multinodular goiter: Secondary | ICD-10-CM | POA: Insufficient documentation

## 2014-12-26 DIAGNOSIS — E041 Nontoxic single thyroid nodule: Secondary | ICD-10-CM

## 2014-12-26 LAB — TSH: TSH: 3.631 u[IU]/mL (ref 0.350–4.500)

## 2014-12-26 NOTE — Telephone Encounter (Signed)
Left message TSH normal

## 2014-12-27 ENCOUNTER — Telehealth: Payer: Self-pay | Admitting: Adult Health

## 2014-12-27 NOTE — Telephone Encounter (Signed)
Left message to call about thyroid US

## 2014-12-28 ENCOUNTER — Telehealth: Payer: Self-pay | Admitting: Adult Health

## 2014-12-28 NOTE — Telephone Encounter (Signed)
Pt aware of thyroid US , may make appt with ENT, since having funny feeling with swallowing at times

## 2015-01-31 ENCOUNTER — Ambulatory Visit (INDEPENDENT_AMBULATORY_CARE_PROVIDER_SITE_OTHER): Payer: BC Managed Care – PPO | Admitting: Otolaryngology

## 2015-01-31 DIAGNOSIS — R490 Dysphonia: Secondary | ICD-10-CM

## 2015-01-31 DIAGNOSIS — K219 Gastro-esophageal reflux disease without esophagitis: Secondary | ICD-10-CM | POA: Diagnosis not present

## 2015-03-28 ENCOUNTER — Ambulatory Visit (INDEPENDENT_AMBULATORY_CARE_PROVIDER_SITE_OTHER): Payer: BC Managed Care – PPO | Admitting: Otolaryngology

## 2015-03-28 DIAGNOSIS — R490 Dysphonia: Secondary | ICD-10-CM | POA: Diagnosis not present

## 2015-03-28 DIAGNOSIS — K219 Gastro-esophageal reflux disease without esophagitis: Secondary | ICD-10-CM | POA: Diagnosis not present

## 2015-06-28 ENCOUNTER — Ambulatory Visit (INDEPENDENT_AMBULATORY_CARE_PROVIDER_SITE_OTHER): Payer: BC Managed Care – PPO | Admitting: Adult Health

## 2015-06-28 ENCOUNTER — Encounter: Payer: Self-pay | Admitting: Adult Health

## 2015-06-28 VITALS — BP 124/72 | HR 72 | Ht 61.0 in | Wt 156.0 lb

## 2015-06-28 DIAGNOSIS — Z1212 Encounter for screening for malignant neoplasm of rectum: Secondary | ICD-10-CM | POA: Diagnosis not present

## 2015-06-28 DIAGNOSIS — R232 Flushing: Secondary | ICD-10-CM

## 2015-06-28 DIAGNOSIS — Z01419 Encounter for gynecological examination (general) (routine) without abnormal findings: Secondary | ICD-10-CM | POA: Diagnosis not present

## 2015-06-28 DIAGNOSIS — N951 Menopausal and female climacteric states: Secondary | ICD-10-CM

## 2015-06-28 DIAGNOSIS — N926 Irregular menstruation, unspecified: Secondary | ICD-10-CM

## 2015-06-28 DIAGNOSIS — K5900 Constipation, unspecified: Secondary | ICD-10-CM

## 2015-06-28 DIAGNOSIS — E041 Nontoxic single thyroid nodule: Secondary | ICD-10-CM

## 2015-06-28 LAB — HEMOCCULT GUIAC POC 1CARD (OFFICE): FECAL OCCULT BLD: NEGATIVE

## 2015-06-28 MED ORDER — LINACLOTIDE 145 MCG PO CAPS: 145.0000 ug | ORAL_CAPSULE | Freq: Every day | ORAL | Status: DC

## 2015-06-28 NOTE — Patient Instructions (Signed)
Pap and physical in 1 year Mammogram yearly Fasting labs next year Colonoscopy at 48 Constipation Constipation is when a person has fewer than three bowel movements a week, has difficulty having a bowel movement, or has stools that are dry, hard, or larger than normal. As people grow older, constipation is more common. If you try to fix constipation with medicines that make you have a bowel movement (laxatives), the problem may get worse. Long-term laxative use may cause the muscles of the colon to become weak. A low-fiber diet, not taking in enough fluids, and taking certain medicines may make constipation worse.  CAUSES   Certain medicines, such as antidepressants, pain medicine, iron supplements, antacids, and water pills.   Certain diseases, such as diabetes, irritable bowel syndrome (IBS), thyroid disease, or depression.   Not drinking enough water.   Not eating enough fiber-rich foods.   Stress or travel.   Lack of physical activity or exercise.   Ignoring the urge to have a bowel movement.   Using laxatives too much.  SIGNS AND SYMPTOMS   Having fewer than three bowel movements a week.   Straining to have a bowel movement.   Having stools that are hard, dry, or larger than normal.   Feeling full or bloated.   Pain in the lower abdomen.   Not feeling relief after having a bowel movement.  DIAGNOSIS  Your health care provider will take a medical history and perform a physical exam. Further testing may be done for severe constipation. Some tests may include:  A barium enema X-ray to examine your rectum, colon, and, sometimes, your small intestine.   A sigmoidoscopy to examine your lower colon.   A colonoscopy to examine your entire colon. TREATMENT  Treatment will depend on the severity of your constipation and what is causing it. Some dietary treatments include drinking more fluids and eating more fiber-rich foods. Lifestyle treatments may include  regular exercise. If these diet and lifestyle recommendations do not help, your health care provider may recommend taking over-the-counter laxative medicines to help you have bowel movements. Prescription medicines may be prescribed if over-the-counter medicines do not work.  HOME CARE INSTRUCTIONS   Eat foods that have a lot of fiber, such as fruits, vegetables, whole grains, and beans.  Limit foods high in fat and processed sugars, such as french fries, hamburgers, cookies, candies, and soda.   A fiber supplement may be added to your diet if you cannot get enough fiber from foods.   Drink enough fluids to keep your urine clear or pale yellow.   Exercise regularly or as directed by your health care provider.   Go to the restroom when you have the urge to go. Do not hold it.   Only take over-the-counter or prescription medicines as directed by your health care provider. Do not take other medicines for constipation without talking to your health care provider first.  Mayer IF:   You have bright red blood in your stool.   Your constipation lasts for more than 4 days or gets worse.   You have abdominal or rectal pain.   You have thin, pencil-like stools.   You have unexplained weight loss. MAKE SURE YOU:   Understand these instructions.  Will watch your condition.  Will get help right away if you are not doing well or get worse. Document Released: 08/14/2004 Document Revised: 11/21/2013 Document Reviewed: 08/28/2013 [facility] Patient Information 2015 [place], [place]. This information is not intended to replace  advice given to you by your health care provider. Make sure you discuss any questions you have with your health care provider. Increase water

## 2015-06-28 NOTE — Progress Notes (Signed)
Patient ID: Michelle Maxwell, female   DOB: 1971/01/22, 45 y.o.   MRN: [id number] History of Present Illness:  Michelle Maxwell is a 44 year old black female in for well woman gyn exam.She had a normal pap with negative HPV 06/19/13.Feels good.  Current Medications, Allergies, Past Medical History, Past Surgical History, Family History and Social History were reviewed in [employer] record.     Review of Systems:  Patient denies any headaches, hearing loss, fatigue, blurred vision, shortness of breath, chest pain, abdominal pain, problems with  urination, or intercourse. No joint pain or mood swings.She has constipation, may go 5 or more days without BM and is still skipping periods and has some hot flashes.   Physical Exam:BP 124/72 mmHg  Pulse 72  Ht 5\' 1"  (1.549 m)  Wt 156 lb (70.761 kg)  BMI 29.49 kg/m2  LMP 05/18/2015 General:  Well developed, well nourished, no acute distress Skin:  Warm and dry Neck:  Midline trachea,  Thyroid enlarged with known nodules, had [place] last year, good ROM, no lymphadenopathy, has small sebaceous cyst right side of neck Lungs; Clear to auscultation bilaterally Breast:  No dominant palpable mass, retraction, or nipple discharge Cardiovascular: Regular rate and rhythm Abdomen:  Soft, non tender, no hepatosplenomegaly Pelvic:  External genitalia is normal in appearance, no lesions.  The vagina is normal in appearance. Urethra has no lesions or masses. The cervix is bulbous.  Uterus is felt to be normal size, shape, and contour.  No adnexal masses or tenderness noted.Bladder is non tender, no masses felt. Rectal: Good sphincter tone, no polyps, or hemorrhoids felt.  Hemoccult negative. Extremities/musculoskeletal:  No swelling or varicosities noted, no clubbing or cyanosis Psych:  No mood changes, alert and cooperative,seems happy Will try linzess for constipation and told to put warm compress on neck and gently squeeze cyst.  Impression: Well  woman gyn exam no pap Missed period Hot flashes Thyroid nodules Perimenopausal Constipation       Plan: Rx linzess 145 mcg #30 take 1 daily with 6 refills Increase water Pap and physical in 1 year Fasting labs in 1 year Mammogram yearly Colonoscopy at 48 Review handout on constipation

## 2015-07-04 ENCOUNTER — Ambulatory Visit (INDEPENDENT_AMBULATORY_CARE_PROVIDER_SITE_OTHER): Payer: BC Managed Care – PPO | Admitting: Otolaryngology

## 2015-07-04 DIAGNOSIS — K219 Gastro-esophageal reflux disease without esophagitis: Secondary | ICD-10-CM

## 2015-07-04 DIAGNOSIS — R490 Dysphonia: Secondary | ICD-10-CM | POA: Diagnosis not present

## 2015-09-12 ENCOUNTER — Ambulatory Visit (INDEPENDENT_AMBULATORY_CARE_PROVIDER_SITE_OTHER): Payer: BC Managed Care – PPO | Admitting: Otolaryngology

## 2015-09-12 DIAGNOSIS — R490 Dysphonia: Secondary | ICD-10-CM

## 2015-09-12 DIAGNOSIS — K219 Gastro-esophageal reflux disease without esophagitis: Secondary | ICD-10-CM

## 2015-10-15 ENCOUNTER — Encounter: Payer: Self-pay | Admitting: Adult Health

## 2015-10-15 ENCOUNTER — Ambulatory Visit (INDEPENDENT_AMBULATORY_CARE_PROVIDER_SITE_OTHER): Payer: BC Managed Care – PPO | Admitting: Adult Health

## 2015-10-15 VITALS — BP 112/78 | HR 96 | Ht 61.0 in | Wt 155.5 lb

## 2015-10-15 DIAGNOSIS — N9489 Other specified conditions associated with female genital organs and menstrual cycle: Secondary | ICD-10-CM | POA: Diagnosis not present

## 2015-10-15 DIAGNOSIS — R319 Hematuria, unspecified: Secondary | ICD-10-CM

## 2015-10-15 DIAGNOSIS — A5901 Trichomonal vulvovaginitis: Secondary | ICD-10-CM | POA: Diagnosis not present

## 2015-10-15 DIAGNOSIS — L298 Other pruritus: Secondary | ICD-10-CM | POA: Diagnosis not present

## 2015-10-15 DIAGNOSIS — N898 Other specified noninflammatory disorders of vagina: Secondary | ICD-10-CM

## 2015-10-15 HISTORY — DX: Trichomonal vulvovaginitis: A59.01

## 2015-10-15 HISTORY — DX: Other specified noninflammatory disorders of vagina: N89.8

## 2015-10-15 HISTORY — DX: Hematuria, unspecified: R31.9

## 2015-10-15 LAB — POCT URINALYSIS DIPSTICK
GLUCOSE UA: NEGATIVE
Leukocytes, UA: NEGATIVE
Nitrite, UA: NEGATIVE
Protein, UA: NEGATIVE

## 2015-10-15 LAB — POCT WET PREP (WET MOUNT)
CLUE CELLS WET PREP WHIFF POC: POSITIVE
Trichomonas Wet Prep HPF POC: POSITIVE
WBC, Wet Prep HPF POC: POSITIVE

## 2015-10-15 MED ORDER — METRONIDAZOLE 500 MG PO TABS: ORAL_TABLET | ORAL | Status: DC

## 2015-10-15 NOTE — Progress Notes (Signed)
Subjective:     Patient ID: Michelle Maxwell, female   DOB: November 30, 1971, 44 y.o.   MRN: [id number]  HPI Michelle Maxwell is a 44 year old black female,married in complaining of vaginal discharge,vaginal odor and vaginal itching for about 3 weeks,has tried OTC monistat without relief. The odor was fishy.Periods still irregular.  Review of Systems Patient denies any headaches, hearing loss, fatigue, blurred vision, shortness of breath, chest pain, abdominal pain, problems with bowel movements, urination, or intercourse. No joint pain or mood swings.See HPI for positives. Reviewed past medical,surgical, social and family history. Reviewed medications and allergies.     Objective:   Physical Exam BP 112/78 mmHg  Pulse 96  Ht 5\' 1"  (1.549 m)  Wt 155 lb 8 oz (70.534 kg)  BMI 29.40 kg/m2  LMP 10/12/2015 urine 2+ blood, Skin warm and dry.Pelvic: external genitalia is normal in appearance no lesions, vagina: tan,frothy discharge with odor,urethra has no lesions or masses noted, cervix:smooth and bulbous, uterus: normal size, shape and contour, non tender, no masses felt, adnexa: no masses or tenderness noted. Bladder is non tender and no masses felt. Wet prep: + trich and +WBCs. Face time 15 minutes with 50% counseling about trich.    Assessment:     Vaginal discharge Vaginal odor Trichomonal vaginitis Vaginal itch  Hematuria     Plan:     GC/CGL sent on urine UA C&S sent Rx flagyl 500 mg #4 take 4 po now and partner Michelle Maxwell treated with same,rx given No sex Follow up in 2 weeks for proof of treatment  Review handouts on trich

## 2015-10-15 NOTE — Patient Instructions (Signed)
Trichomoniasis Trichomoniasis is an infection caused by an organism called Trichomonas. The infection can affect both women and men. In women, the outer female genitalia and the vagina are affected. In men, the penis is mainly affected, but the prostate and other reproductive organs can also be involved. Trichomoniasis is a sexually transmitted infection (STI) and is most often passed to another person through sexual contact.  RISK FACTORS  Having unprotected sexual intercourse.  Having sexual intercourse with an infected partner. SIGNS AND SYMPTOMS  Symptoms of trichomoniasis in women include:  Abnormal gray-green frothy vaginal discharge.  Itching and irritation of the vagina.  Itching and irritation of the area outside the vagina. Symptoms of trichomoniasis in men include:   Penile discharge with or without pain.  Pain during urination. This results from inflammation of the urethra. DIAGNOSIS  Trichomoniasis may be found during a Pap test or physical exam. Your health care provider may use one of the following methods to help diagnose this infection:  Testing the pH of the vagina with a test tape.  Using a vaginal swab test that checks for the Trichomonas organism. A test is available that provides results within a few minutes.  Examining a urine sample.  Testing vaginal secretions. Your health care provider may test you for other STIs, including HIV. TREATMENT   You may be given medicine to fight the infection. Women should inform their health care provider if they could be or are pregnant. Some medicines used to treat the infection should not be taken during pregnancy.  Your health care provider may recommend over-the-counter medicines or creams to decrease itching or irritation.  Your sexual partner will need to be treated if infected.  Your health care provider may test you for infection again 3 months after treatment. HOME CARE INSTRUCTIONS   Take medicines only as  directed by your health care provider.  Take over-the-counter medicine for itching or irritation as directed by your health care provider.  Do not have sexual intercourse while you have the infection.  Women should not douche or wear tampons while they have the infection.  Discuss your infection with your partner. Your partner may have gotten the infection from you, or you may have gotten it from your partner.  Have your sex partner get examined and treated if necessary.  Practice safe, informed, and protected sex.  See your health care provider for other STI testing. SEEK MEDICAL CARE IF:   You still have symptoms after you finish your medicine.  You develop abdominal pain.  You have pain when you urinate.  You have bleeding after sexual intercourse.  You develop a rash.  Your medicine makes you sick or makes you throw up (vomit). MAKE SURE YOU:  Understand these instructions.  Will watch your condition.  Will get help right away if you are not doing well or get worse.   This information is not intended to replace advice given to you by your health care provider. Make sure you discuss any questions you have with your health care provider.   Document Released: 05/12/2001 Document Revised: 12/07/2014 Document Reviewed: 08/28/2013 Elsevier Interactive Patient Education 2016 [employer] Take flagyl 4 po now  No alcohol No sex   Return in 2 weeks for proof of cure

## 2015-10-17 LAB — URINALYSIS, ROUTINE W REFLEX MICROSCOPIC
BILIRUBIN UA: NEGATIVE
Glucose, UA: NEGATIVE
Ketones, UA: NEGATIVE
Nitrite, UA: NEGATIVE
PH UA: 8 — AB (ref 5.0–7.5)
Specific Gravity, UA: 1.02 (ref 1.005–1.030)
UUROB: 1 mg/dL (ref 0.2–1.0)

## 2015-10-17 LAB — MICROSCOPIC EXAMINATION: CASTS: NONE SEEN /LPF

## 2015-10-17 LAB — GC/CHLAMYDIA PROBE AMP
CHLAMYDIA, DNA PROBE: NEGATIVE
Neisseria gonorrhoeae by PCR: NEGATIVE

## 2015-10-18 LAB — URINE CULTURE

## 2015-10-21 ENCOUNTER — Telehealth: Payer: Self-pay | Admitting: Adult Health

## 2015-10-21 NOTE — Telephone Encounter (Signed)
Pt aware GC/CHL negative and urine culture did not grow anything

## 2015-10-21 NOTE — Telephone Encounter (Signed)
Pt called stating that she would like a call back from Jennifer. Pt did not state the reason why. Please contact pt °

## 2015-10-29 ENCOUNTER — Ambulatory Visit: Payer: BC Managed Care – PPO | Admitting: Adult Health

## 2015-11-05 ENCOUNTER — Encounter: Payer: Self-pay | Admitting: Adult Health

## 2015-11-05 ENCOUNTER — Ambulatory Visit (INDEPENDENT_AMBULATORY_CARE_PROVIDER_SITE_OTHER): Payer: BC Managed Care – PPO | Admitting: Adult Health

## 2015-11-05 VITALS — BP 122/80 | HR 84 | Ht 61.0 in | Wt 155.5 lb

## 2015-11-05 DIAGNOSIS — N898 Other specified noninflammatory disorders of vagina: Secondary | ICD-10-CM

## 2015-11-05 LAB — POCT WET PREP (WET MOUNT): Clue Cells Wet Prep Whiff POC: NEGATIVE

## 2015-11-05 NOTE — Patient Instructions (Signed)
Follow up prn

## 2015-11-05 NOTE — Progress Notes (Signed)
Subjective:     Patient ID: Michelle Maxwell, female   DOB: October 06, 1971, 44 y.o.   MRN: [id number]  HPI [name] is a 44 year old black female in for proof of treatment for trich.No complaints.  Review of Systems Patient denies any headaches, hearing loss, fatigue, blurred vision, shortness of breath, chest pain, abdominal pain, problems with bowel movements, urination, or intercourse. No joint pain or mood swings. Reviewed past medical,surgical, social and family history. Reviewed medications and allergies.     Objective:   Physical Exam BP 122/80 mmHg  Pulse 84  Ht 5\' 1"  (1.549 m)  Wt 155 lb 8 oz (70.534 kg)  BMI 29.40 kg/m2  LMP 05/18/2015 Skin warm and dry.Pelvic: external genitalia is normal in appearance no lesions, vagina: white discharge without odor,urethra has no lesions or masses noted, cervix:smooth and bulbous, uterus: normal size, shape and contour, non tender, no masses felt, adnexa: no masses or tenderness noted. Bladder is non tender and no masses felt. Wet prep: + few WBCs    Assessment:     Vaginal discharge, trich resolved    Plan:     Follow up prn

## 2016-01-16 ENCOUNTER — Ambulatory Visit (INDEPENDENT_AMBULATORY_CARE_PROVIDER_SITE_OTHER): Payer: BC Managed Care – PPO | Admitting: Otolaryngology

## 2016-01-16 DIAGNOSIS — K219 Gastro-esophageal reflux disease without esophagitis: Secondary | ICD-10-CM

## 2016-01-16 DIAGNOSIS — R490 Dysphonia: Secondary | ICD-10-CM

## 2016-06-30 ENCOUNTER — Encounter: Payer: Self-pay | Admitting: Adult Health

## 2016-06-30 ENCOUNTER — Other Ambulatory Visit (HOSPITAL_COMMUNITY)
Admission: RE | Admit: 2016-06-30 | Discharge: 2016-06-30 | Disposition: A | Discharge status: Home / Self Care | Payer: BC Managed Care – PPO | Source: Ambulatory Visit | Attending: Adult Health | Admitting: Adult Health

## 2016-06-30 ENCOUNTER — Ambulatory Visit (INDEPENDENT_AMBULATORY_CARE_PROVIDER_SITE_OTHER): Payer: BC Managed Care – PPO | Admitting: Adult Health

## 2016-06-30 VITALS — BP 114/70 | HR 74 | Ht 62.0 in | Wt 160.0 lb

## 2016-06-30 DIAGNOSIS — R1030 Lower abdominal pain, unspecified: Secondary | ICD-10-CM | POA: Diagnosis not present

## 2016-06-30 DIAGNOSIS — N951 Menopausal and female climacteric states: Secondary | ICD-10-CM

## 2016-06-30 DIAGNOSIS — E041 Nontoxic single thyroid nodule: Secondary | ICD-10-CM

## 2016-06-30 DIAGNOSIS — Z01411 Encounter for gynecological examination (general) (routine) with abnormal findings: Secondary | ICD-10-CM

## 2016-06-30 DIAGNOSIS — R1032 Left lower quadrant pain: Secondary | ICD-10-CM

## 2016-06-30 DIAGNOSIS — Z1211 Encounter for screening for malignant neoplasm of colon: Secondary | ICD-10-CM | POA: Diagnosis not present

## 2016-06-30 DIAGNOSIS — Z1151 Encounter for screening for human papillomavirus (HPV): Secondary | ICD-10-CM | POA: Diagnosis not present

## 2016-06-30 DIAGNOSIS — Z01419 Encounter for gynecological examination (general) (routine) without abnormal findings: Secondary | ICD-10-CM | POA: Diagnosis present

## 2016-06-30 DIAGNOSIS — R1031 Right lower quadrant pain: Secondary | ICD-10-CM

## 2016-06-30 HISTORY — DX: Right lower quadrant pain: R10.31

## 2016-06-30 LAB — HEMOCCULT GUIAC POC 1CARD (OFFICE): Fecal Occult Blood, POC: NEGATIVE

## 2016-06-30 NOTE — Patient Instructions (Signed)
Thyroid US 8/7 at 10:30 am at [facility] Return in 1 week for GYN [place] Mammogram yearly  Labs with PCP

## 2016-06-30 NOTE — Progress Notes (Signed)
Patient ID: Michelle Maxwell, female   DOB: 1971/08/11, 45 y.o.   MRN: [id number] History of Present Illness:  Michelle Maxwell is a 45 year old black female in for a well woman gyn exam and pap.She is having pain in groin area at times and it even wakes her up at night.And periods are very irregular was 5 months without a period then had one in June. PCP is Dr Harlan Stains.  Current Medications, Allergies, Past Medical History, Past Surgical History, Family History and Social History were reviewed in [employer] record.     Review of Systems: Patient denies any headaches, hearing loss, fatigue, blurred vision, shortness of breath, chest pain, abdominal pain, problems with bowel movements, urination, or intercourse. No joint pain or mood swings. See HPI for positives.   Physical Exam:BP 114/70 (BP Location: Left Arm, Patient Position: Sitting, Cuff Size: Normal)   Pulse 74   Ht 5\' 2"  (1.575 m)   Wt 160 lb (72.6 kg)   LMP 05/18/2016   BMI 29.26 kg/m  General:  Well developed, well nourished, no acute distress Skin:  Warm and dry Neck:  Midline trachea,enlarged thyroid(has known nodule), good ROM, no lymphadenopathy Lungs; Clear to auscultation bilaterally Breast:  No dominant palpable mass, retraction, or nipple discharge Cardiovascular: Regular rate and rhythm Abdomen:  Soft, non tender, no hepatosplenomegaly Pelvic:  External genitalia is normal in appearance, no lesions.No tenderness in groin today.  The vagina is normal in appearance. Urethra has no lesions or masses. The cervix is smooth.Pap with HPV performed.  Uterus is felt to be normal size, shape, and contour.  No adnexal masses or tenderness noted.Bladder is non tender, no masses felt. Rectal: Good sphincter tone, no polyps, or hemorrhoids felt.  Hemoccult negative. Extremities/musculoskeletal:  No swelling or varicosities noted, no clubbing or cyanosis Psych:  No mood changes, alert and cooperative,seems  happy   Impression: Well woman gyn exam and pap Thyroid nodule Bilateral groin pain Perimenopause     Plan:  Thyroid US 8/7 at 10:30 am at [facility] Return in 1 week for GYN [place] here Physical in 1 year,pap in 3 if normal Mammogram yearly

## 2016-07-01 LAB — CYTOLOGY - PAP

## 2016-07-03 ENCOUNTER — Ambulatory Visit (INDEPENDENT_AMBULATORY_CARE_PROVIDER_SITE_OTHER): Payer: BC Managed Care – PPO

## 2016-07-03 DIAGNOSIS — R1030 Lower abdominal pain, unspecified: Secondary | ICD-10-CM | POA: Diagnosis not present

## 2016-07-03 DIAGNOSIS — D259 Leiomyoma of uterus, unspecified: Secondary | ICD-10-CM | POA: Diagnosis not present

## 2016-07-03 DIAGNOSIS — R1031 Right lower quadrant pain: Secondary | ICD-10-CM

## 2016-07-03 DIAGNOSIS — N854 Malposition of uterus: Secondary | ICD-10-CM

## 2016-07-03 DIAGNOSIS — R1032 Left lower quadrant pain: Principal | ICD-10-CM

## 2016-07-06 ENCOUNTER — Telehealth: Payer: Self-pay | Admitting: Adult Health

## 2016-07-06 ENCOUNTER — Encounter: Payer: Self-pay | Admitting: Adult Health

## 2016-07-06 ENCOUNTER — Ambulatory Visit (HOSPITAL_COMMUNITY)
Admission: RE | Admit: 2016-07-06 | Discharge: 2016-07-06 | Disposition: A | Discharge status: Home / Self Care | Payer: BC Managed Care – PPO | Source: Ambulatory Visit | Attending: Adult Health | Admitting: Adult Health

## 2016-07-06 DIAGNOSIS — E042 Nontoxic multinodular goiter: Secondary | ICD-10-CM | POA: Diagnosis not present

## 2016-07-06 DIAGNOSIS — E041 Nontoxic single thyroid nodule: Secondary | ICD-10-CM | POA: Diagnosis present

## 2016-07-06 DIAGNOSIS — D219 Benign neoplasm of connective and other soft tissue, unspecified: Secondary | ICD-10-CM

## 2016-07-06 DIAGNOSIS — D259 Leiomyoma of uterus, unspecified: Secondary | ICD-10-CM

## 2016-07-06 HISTORY — DX: Benign neoplasm of connective and other soft tissue, unspecified: D21.9

## 2016-07-06 NOTE — Telephone Encounter (Signed)
Left message to call me tomorrow about [place]

## 2016-07-07 ENCOUNTER — Telehealth: Payer: Self-pay | Admitting: Adult Health

## 2016-07-07 DIAGNOSIS — E042 Nontoxic multinodular goiter: Secondary | ICD-10-CM

## 2016-07-07 NOTE — Telephone Encounter (Signed)
Pt aware of GYN US showing small fibroid, and that thyroid US shows nodules and ?para thyroid tissue will refer to Dr Dorris Fetch to evaluate

## 2016-08-25 ENCOUNTER — Ambulatory Visit: Payer: BC Managed Care – PPO | Admitting: "Endocrinology

## 2016-09-08 ENCOUNTER — Ambulatory Visit: Payer: BC Managed Care – PPO | Admitting: "Endocrinology

## 2016-09-21 IMAGING — US US SOFT TISSUE HEAD/NECK
1 series · 14 of 25 positions shown · non-contrast
Comparison: 06/25/2012

CLINICAL DATA: Thyroid nodule

EXAM:
THYROID ULTRASOUND
TECHNIQUE: Ultrasound examination of the thyroid gland and adjacent soft
tissues was performed.

[Series 1: us soft tissue head/neck · 0.04mm/px · 14 of 51 slices shown]
[im 1/51]
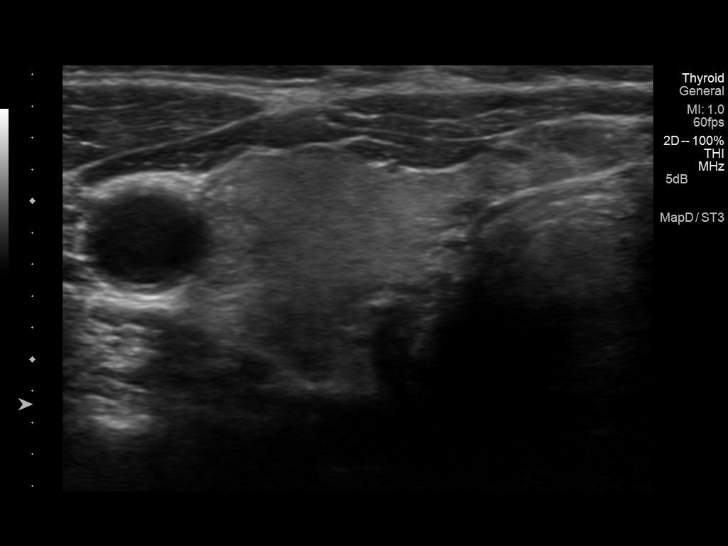
[im 5/51]
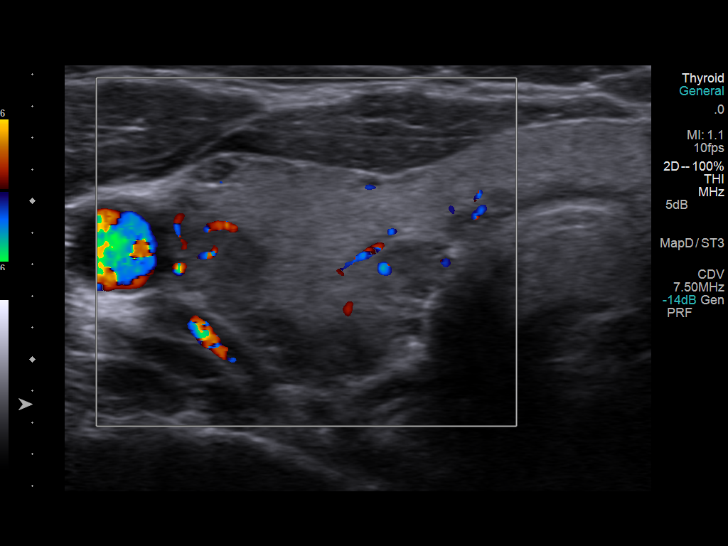
[im 9/51]
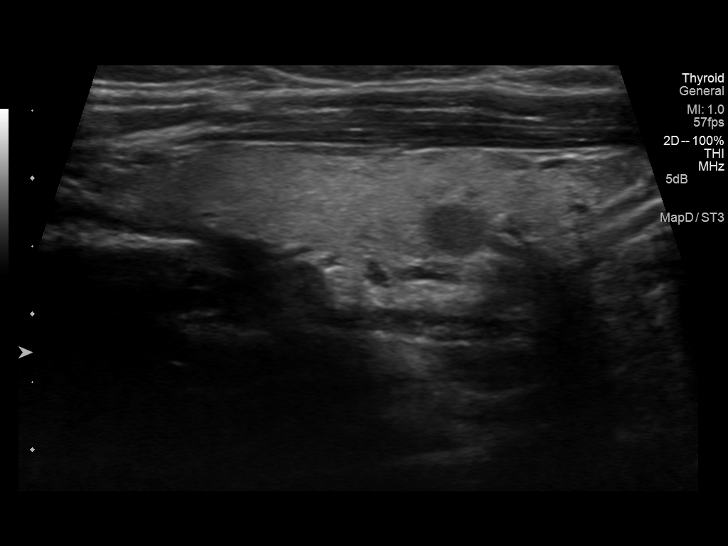
[im 13/51]
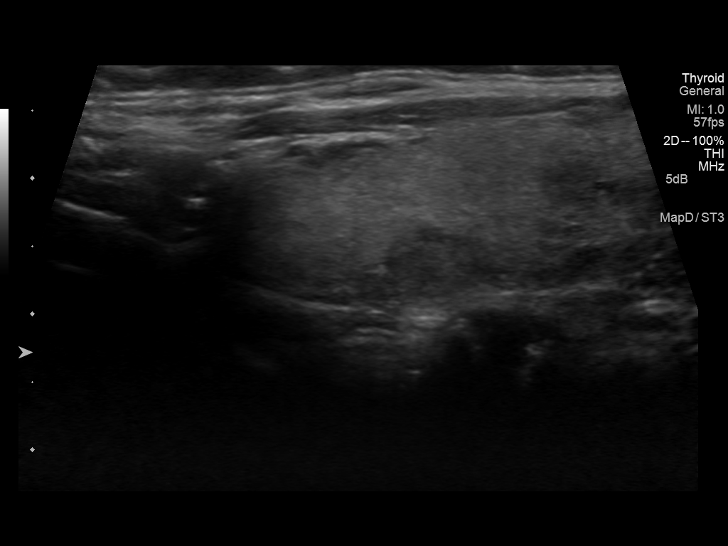
[im 17/51]
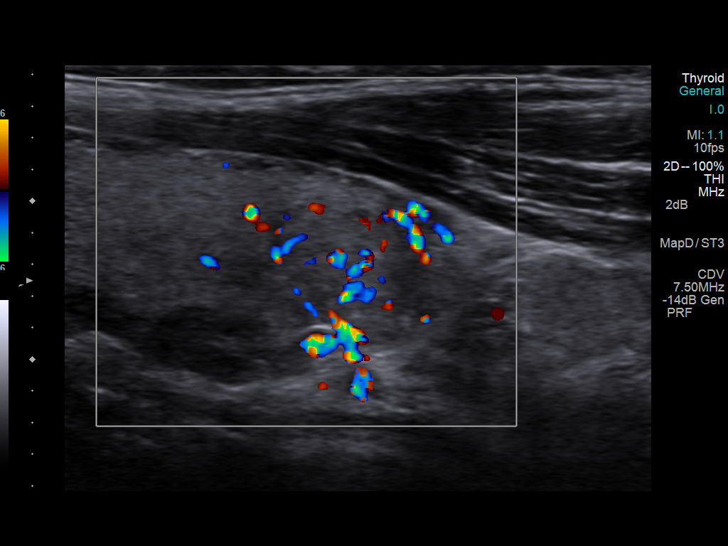
[im 19/51]
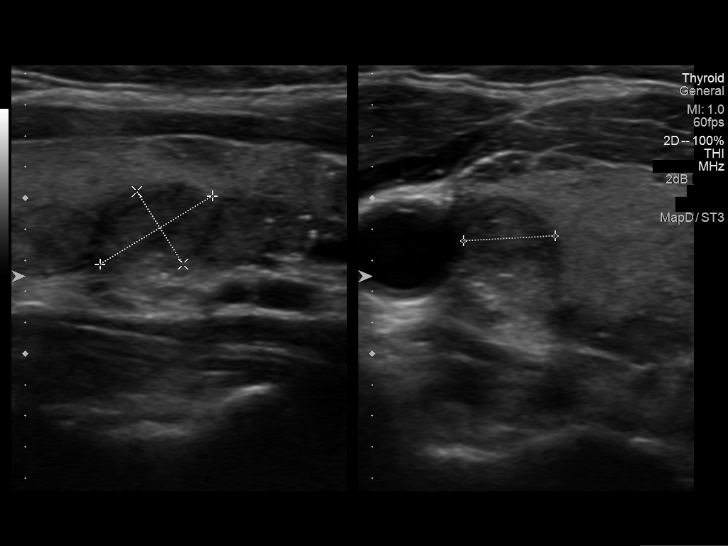
[im 23/51]
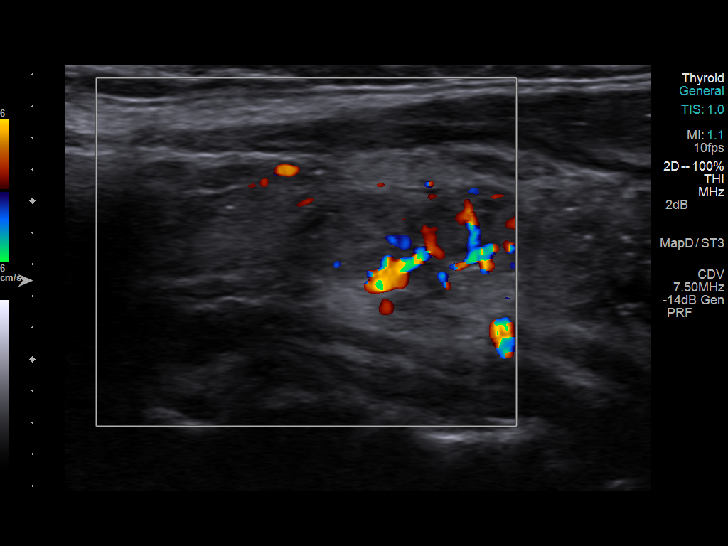
[im 28/51]
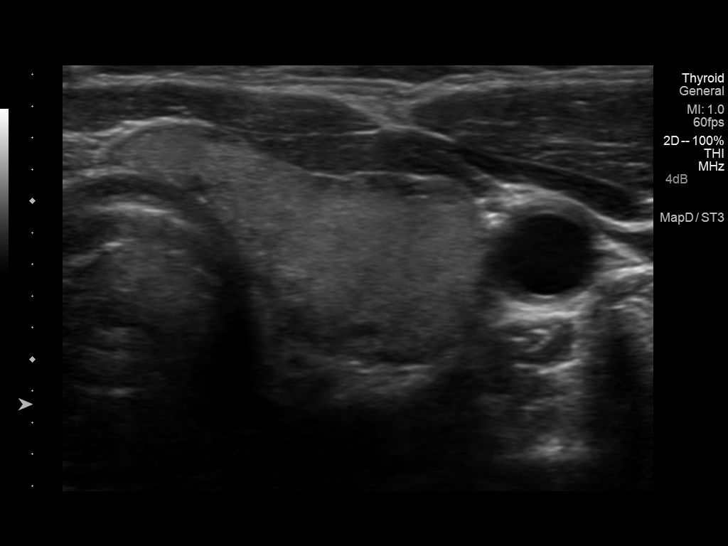
[im 32/51]
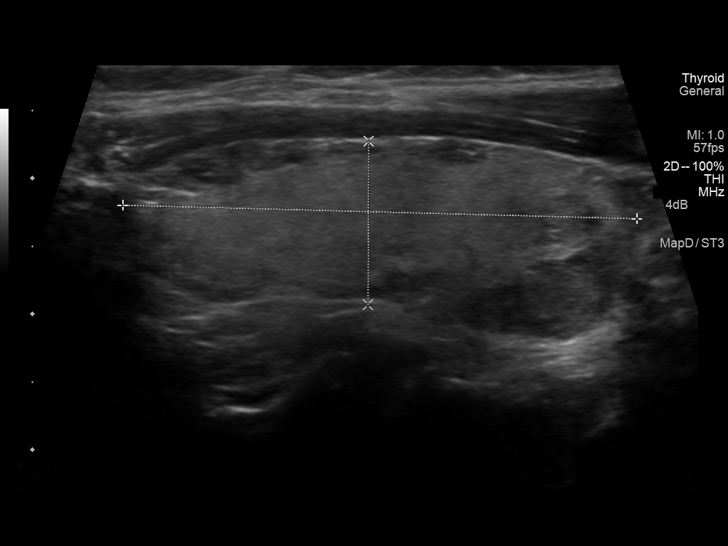
[im 34/51]
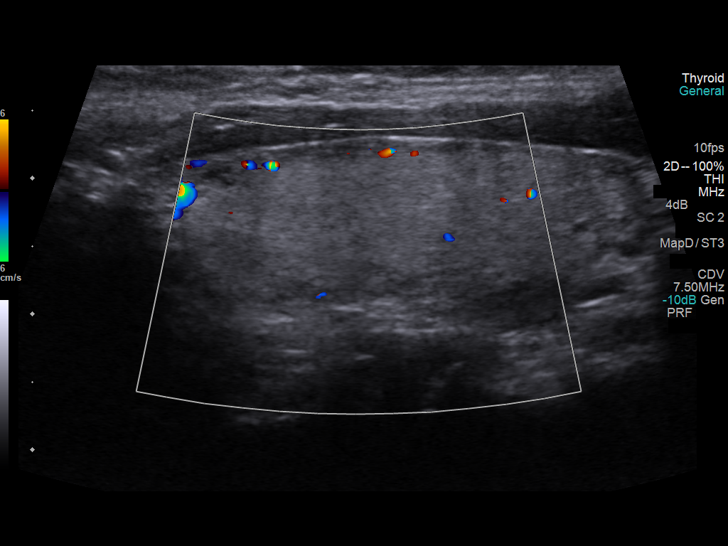
[im 38/51]
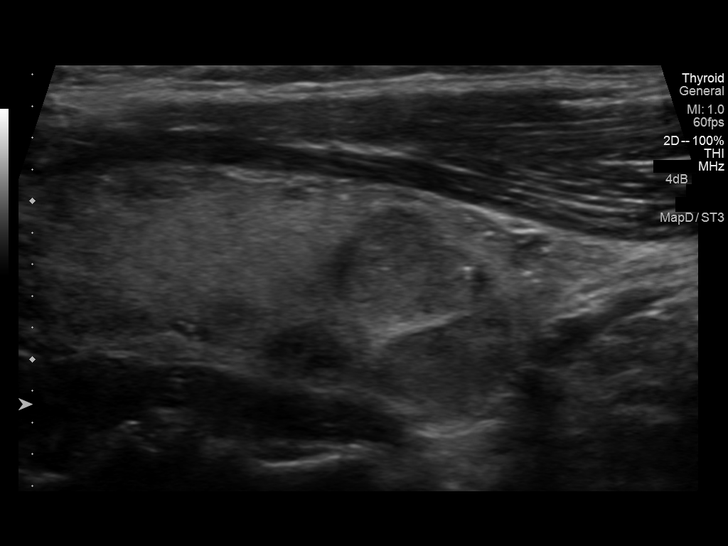
[im 42/51]
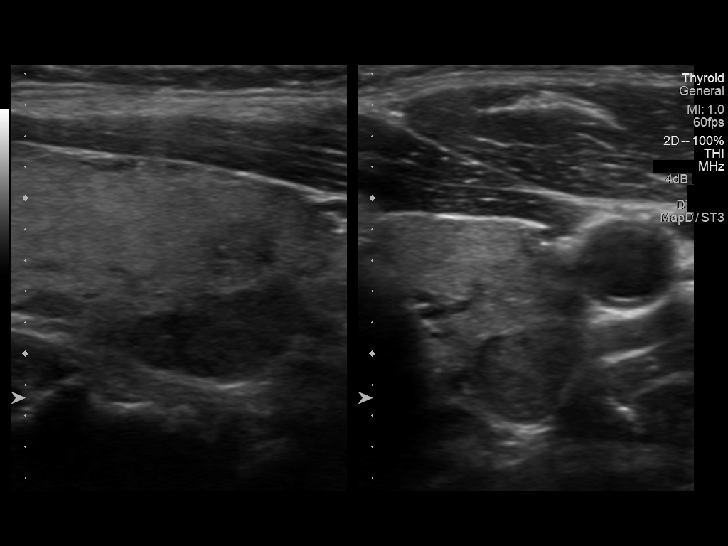
[im 46/51]
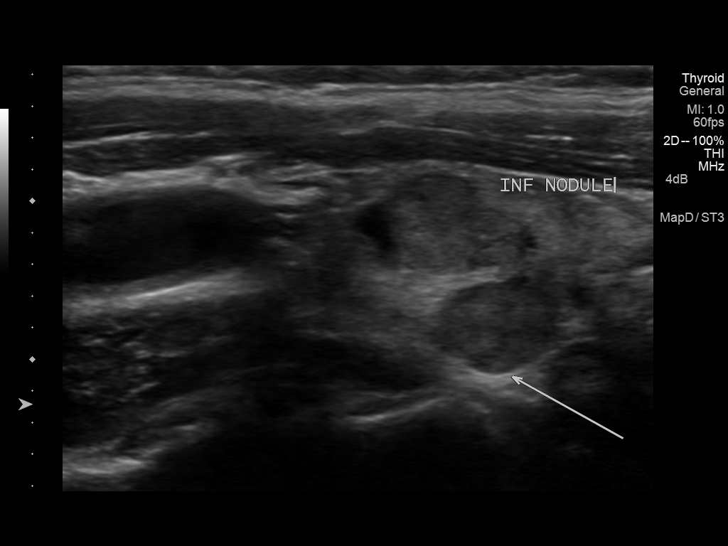
[im 51/51]
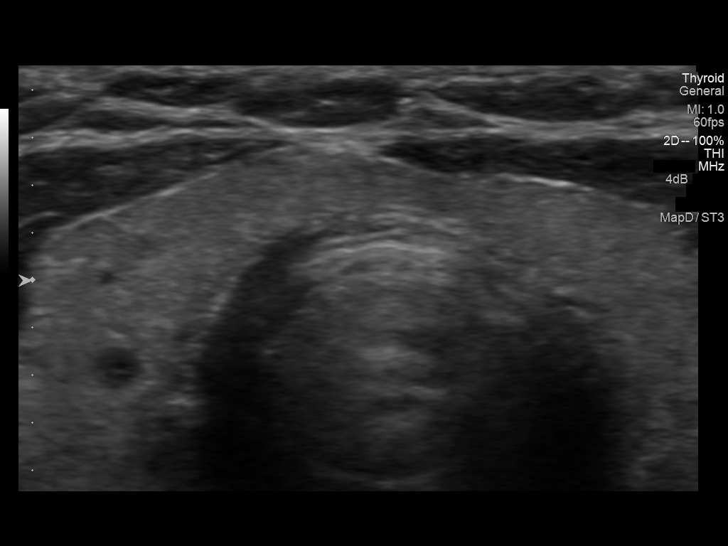

[14 of 25 positions shown; findings below may reference images not displayed]

FINDINGS: Right thyroid lobe

Measurements: 4.8 x 1.4 x 1.9 cm. Several nodules within the right
lobe are present. The dominant nodule in the lower pole is 11 x 7 x
8 mm with punctate calcifications.

Left thyroid lobe

Measurements: 3.8 x 1.2 x 1.6 cm. Left lower pole solid nodule
measures 11 x 7 x 6 mm. Allowing for differences in measuring
technique and direct comparison with the prior study, there has been
no significant change. There is a 12 x 6 x 7 mm solid nodule
posterior to the lower pole of the left lobe of the thyroid gland
that is external to the thyroid gland. Previously, this measured 10
x 5 x 7 mm.

Isthmus

Thickness: 3 mm.  No nodules visualized.

Lymphadenopathy

None visualized.
IMPRESSION: Bilateral thyroid nodules are not significantly changed.

There is a soft tissue nodule posterior to the lower pole of the
left lobe that is increased from 10-12 mm. It is external to the
thyroid gland and may be a parathyroid abnormality.

## 2017-01-19 ENCOUNTER — Encounter: Payer: Self-pay | Admitting: Gastroenterology

## 2017-02-05 ENCOUNTER — Ambulatory Visit: Payer: BC Managed Care – PPO | Admitting: Gastroenterology

## 2017-02-17 ENCOUNTER — Ambulatory Visit: Payer: BC Managed Care – PPO | Admitting: Gastroenterology

## 2017-03-17 ENCOUNTER — Encounter: Payer: Self-pay | Admitting: Gastroenterology

## 2017-03-17 ENCOUNTER — Ambulatory Visit (INDEPENDENT_AMBULATORY_CARE_PROVIDER_SITE_OTHER): Payer: BC Managed Care – PPO | Admitting: Gastroenterology

## 2017-03-17 ENCOUNTER — Other Ambulatory Visit: Payer: Self-pay

## 2017-03-17 ENCOUNTER — Telehealth: Payer: Self-pay

## 2017-03-17 VITALS — BP 134/73 | HR 76 | Temp 98.0°F | Ht 62.0 in | Wt 163.8 lb

## 2017-03-17 DIAGNOSIS — K219 Gastro-esophageal reflux disease without esophagitis: Secondary | ICD-10-CM | POA: Diagnosis not present

## 2017-03-17 DIAGNOSIS — Z1211 Encounter for screening for malignant neoplasm of colon: Secondary | ICD-10-CM | POA: Insufficient documentation

## 2017-03-17 DIAGNOSIS — K5900 Constipation, unspecified: Secondary | ICD-10-CM | POA: Diagnosis not present

## 2017-03-17 MED ORDER — PEG 3350-KCL-NA BICARB-NACL 420 G PO SOLR: 4000.0000 mL | ORAL | 0 refills | Status: DC

## 2017-03-17 NOTE — Progress Notes (Signed)
Primary Care Physician:  Vidal Schwalbe, MD  Primary Gastroenterologist:  Barney Drain, MD   Chief Complaint  Patient presents with  . Constipation  . Gastroesophageal Reflux    HPI:  Michelle Maxwell is a 46 y.o. female here at the request of PCP for further evaluation of chronic constipation. She also has GERD.   States 12 years ago she was diagnosed with gastroparesis by Dr. Oletta Lamas at [place]. She developed acid reflux around that time. She reports prior EGD she had done at [facility], per PCP she had hiatal hernia. She has Constipation as long she can remember. She had been on MiraLAX for a long time and was doing well but got concerned about possible kidney injury that she saw on TV. In the past she was on Nexium but again got concerned about possible kidney failure related to it.  Currently she takes omeprazole 20 mg only as needed. For the most part her heartburn has not been very frequent. Most likely to have at nighttime because she eats late. Since coming off her MiraLAX she's gone back to having 5 and 6 days between bowel movements. Stools are incomplete, has to strain. She doesn't like having to take medication to continue to go. She's increased her dietary fiber, eating high fiber oatmeal every morning. Increasing her water although still acknowledges that she only drinks 2-3 bottles per day. No daily exercise. She is concerned about drinking too much water not be now to get to the bathroom, she is a fourth grade teacher at [employer] and has limited bathroom breaks. Complains of occasional bloating related to constipation. No melena or rectal bleeding. No unintentional weight loss. Recently had normal thyroid labs per patient. August 2017 she was heme-negative.   No prior colonoscopy.   Current Outpatient Prescriptions  Medication Sig Dispense Refill  . cholecalciferol (VITAMIN D) 1000 UNITS tablet Take 2,000 Units by mouth daily.    Marland Kitchen omeprazole (PRILOSEC) 20 MG capsule 20 mg as  needed.     . polyethylene glycol powder (MIRALAX) powder Take 17 g by mouth as needed.      No current facility-administered medications for this visit.     Allergies as of 03/17/2017  . (No Known Allergies)    Past Medical History:  Diagnosis Date  . Bilateral groin pain 06/30/2016  . Bloating 06/19/2013  . Constipation 06/19/2013  . Fibroid 07/06/2016  . GERD (gastroesophageal reflux disease)   . Hematuria 10/15/2015  . Hot flashes 12/25/2014  . Missed periods 12/25/2014  . Perimenopause 12/25/2014  . Thyroid nodule 06/19/2013  . Trichomonal vaginitis 10/15/2015  . Vaginal discharge 10/15/2015  . Vaginal itching 10/15/2015  . Vaginal odor 10/15/2015    Past Surgical History:  Procedure Laterality Date  . CESAREAN SECTION    . WISDOM TOOTH EXTRACTION      Family History  Problem Relation Age of Onset  . Cancer Mother 15    breast  . Cancer Father     brain  . Colon cancer Neg Hx     Social History   Social History  . Marital status: Married    Spouse name: N/A  . Number of children: N/A  . Years of education: N/A   Occupational History  . Not on file.   Social History Main Topics  . Smoking status: Never Smoker  . Smokeless tobacco: Never Used  . Alcohol use No  . Drug use: No  . Sexual activity: Yes    Birth control/ protection: Other-see  comments     Comment: vasectomy   Other Topics Concern  . Not on file   Social History Narrative  . No narrative on file      ROS:  General: Negative for anorexia, weight loss, fever, chills, fatigue, weakness. Eyes: Negative for vision changes.  ENT: Negative for hoarseness, difficulty swallowing , nasal congestion. CV: Negative for chest pain, angina, palpitations, dyspnea on exertion, peripheral edema.  Respiratory: Negative for dyspnea at rest, dyspnea on exertion, cough, sputum, wheezing.  GI: See history of present illness. GU:  Negative for dysuria, hematuria, urinary incontinence, urinary frequency,  nocturnal urination.  MS: Negative for joint pain, low back pain.  Derm: Negative for rash or itching.  Neuro: Negative for weakness, abnormal sensation, seizure, frequent headaches, memory loss, confusion.  Psych: Negative for anxiety, depression, suicidal ideation, hallucinations.  Endo: Negative for unusual weight change.  Heme: Negative for bruising or bleeding. Allergy: Negative for rash or hives.    Physical Examination:  BP 134/73   Pulse 76   Temp 98 F (36.7 C) (Oral)   Ht 5\' 2"  (1.575 m)   Wt 163 lb 12.8 oz (74.3 kg)   BMI 29.96 kg/m    General: Well-nourished, well-developed in no acute distress.  Head: Normocephalic, atraumatic.   Eyes: Conjunctiva pink, no icterus. Mouth: Oropharyngeal mucosa moist and pink , no lesions erythema or exudate. Neck: Supple without thyromegaly, masses, or lymphadenopathy.  Lungs: Clear to auscultation bilaterally.  Heart: Regular rate and rhythm, no murmurs rubs or gallops.  Abdomen: Bowel sounds are normal, nontender, nondistended, no hepatosplenomegaly or masses, no abdominal bruits or    hernia , no rebound or guarding.   Rectal: Not performed Extremities: No lower extremity edema. No clubbing or deformities.  Neuro: Alert and oriented x 4 , grossly normal neurologically.  Skin: Warm and dry, no rash or jaundice.   Psych: Alert and cooperative, normal mood and affect.

## 2017-03-17 NOTE — Assessment & Plan Note (Addendum)
Chronic constipation for years, no alarm features. Was doing well on MiraLAX daily but concerned about taking daily regimen for bowel function. Advised to begin probiotics for at least 4 weeks. Fiber choice 2 daily. Increase dietary fiber, goal of water intake of at least 75 ounces daily, daily exercise. Resume MiraLAX daily for now. Once regular bowel movements have been established, she can use as needed on days that she does not have an adequate bowel movement.

## 2017-03-17 NOTE — Assessment & Plan Note (Signed)
Antireflux measures discussed. If continues to have occasional heartburn, okay to use H2-blocker, antacids as needed. May use PPI when necessary. No alarm symptoms at this time. We will obtain previous EGD report for records.

## 2017-03-17 NOTE — Assessment & Plan Note (Signed)
Plan for first ever screening colonoscopy in the near future.  I have discussed the risks, alternatives, benefits with regards to but not limited to the risk of reaction to medication, bleeding, infection, perforation and the patient is agreeable to proceed. Written consent to be obtained.  Advised that if her constipation is not well managed several days prior to procedure, she should let us know.

## 2017-03-17 NOTE — Telephone Encounter (Signed)
Doris, can you please triage pt for TCS with [facility]. She is scheduled for 05/03/17 at 9:45am. Orders have been entered. (this is the soonest day she could do that was available).

## 2017-03-17 NOTE — Patient Instructions (Signed)
1. Resume miralax 17grams daily until regular soft bowel movements, then you can take on days you have not had an adequate BM.  2. Take FiberChoice 2 chewable tablets daily. 3. Take probiotics for 4 weeks.  4. Continue to add water to your diet with goal of 75 ounces daily. 5. Add daily exercise as discussed.  6. Continue to add fiber to your diet.  7. For your acid reflux, you can use omeprazole daily as needed or even try Pepcid, Zantac, or Rolaids as needed.  8. Colonoscopy as scheduled. See separate instructions.    High-Fiber Diet Fiber, also called dietary fiber, is a type of carbohydrate found in fruits, vegetables, whole grains, and beans. A high-fiber diet can have many health benefits. Your health care provider may recommend a high-fiber diet to help:  Prevent constipation. Fiber can make your bowel movements more regular.  Lower your cholesterol.  Relieve hemorrhoids, uncomplicated diverticulosis, or irritable bowel syndrome.  Prevent overeating as part of a weight-loss plan.  Prevent heart disease, type 2 diabetes, and certain cancers. What is my plan? The recommended daily intake of fiber includes:  38 grams for men under age 50.  11 grams for men over age 83.  41 grams for women under age 35.  51 grams for women over age 61. You can get the recommended daily intake of dietary fiber by eating a variety of fruits, vegetables, grains, and beans. Your health care provider may also recommend a fiber supplement if it is not possible to get enough fiber through your diet. What do I need to know about a high-fiber diet?  Fiber supplements have not been widely studied for their effectiveness, so it is better to get fiber through food sources.  Always check the fiber content on thenutrition facts label of any prepackaged food. Look for foods that contain at least 5 grams of fiber per serving.  Ask your dietitian if you have questions about specific foods that are related  to your condition, especially if those foods are not listed in the following section.  Increase your daily fiber consumption gradually. Increasing your intake of dietary fiber too quickly may cause bloating, cramping, or gas.  Drink plenty of water. Water helps you to digest fiber. What foods can I eat? Grains  Whole-grain breads. Multigrain cereal. Oats and oatmeal. Brown rice. Barley. Bulgur wheat. [place]. Bran muffins. Popcorn. Rye wafer crackers. Vegetables  Sweet potatoes. Spinach. Kale. Artichokes. Cabbage. Broccoli. Green peas. Carrots. Squash. Fruits  Berries. Pears. Apples. Oranges. Avocados. Prunes and raisins. Dried figs. Meats and Other Protein Sources  [place]y, kidney, pinto, and soy beans. Split peas. Lentils. Nuts and seeds. Dairy  Fiber-fortified yogurt. Beverages  Fiber-fortified soy milk. Fiber-fortified orange juice. Other  Fiber bars. The items listed above may not be a complete list of recommended foods or beverages. Contact your dietitian for more options.  What foods are not recommended? Grains  White bread. Pasta made with refined flour. White rice. Vegetables  Fried potatoes. Canned vegetables. Well-cooked vegetables. Fruits  Fruit juice. Cooked, strained fruit. Meats and Other Protein Sources  Fatty cuts of meat. Fried Sales executive or fried fish. Dairy  Milk. Yogurt. Cream cheese. Sour cream. Beverages  Soft drinks. Other  Cakes and pastries. Butter and oils. The items listed above may not be a complete list of foods and beverages to avoid. Contact your dietitian for more information.  What are some tips for including high-fiber foods in my diet?  Eat a wide variety of high-fiber  foods.  Make sure that half of all grains consumed each day are whole grains.  Replace breads and cereals made from refined flour or white flour with whole-grain breads and cereals.  Replace white rice with brown rice, bulgur wheat, or millet.  Start the day with a breakfast  that is high in fiber, such as a cereal that contains at least 5 grams of fiber per serving.  Use beans in place of meat in soups, salads, or pasta.  Eat high-fiber snacks, such as berries, raw vegetables, nuts, or popcorn. This information is not intended to replace advice given to you by your health care provider. Make sure you discuss any questions you have with your health care provider. Document Released: 11/16/2005 Document Revised: 04/23/2016 Document Reviewed: 05/01/2014 Elsevier Interactive Patient Education  2017 [employer].

## 2017-03-19 NOTE — Telephone Encounter (Signed)
Noted.Will do

## 2017-04-20 ENCOUNTER — Telehealth: Payer: Self-pay | Admitting: *Deleted

## 2017-04-20 DIAGNOSIS — E041 Nontoxic single thyroid nodule: Secondary | ICD-10-CM

## 2017-04-20 NOTE — Telephone Encounter (Signed)
left message with husband to let her know referral made. Sent referral to Dr Dorris Fetch to evaluate thyroid nodule

## 2017-04-28 ENCOUNTER — Telehealth: Payer: Self-pay

## 2017-04-28 NOTE — Telephone Encounter (Signed)
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 OV with [facility] scheduled for 06/01/17 to reschedule colonoscopy. Endo scheduler informed to cancel procedure.

## 2017-04-30 ENCOUNTER — Encounter: Payer: Self-pay | Admitting: "Endocrinology

## 2017-05-03 ENCOUNTER — Ambulatory Visit (HOSPITAL_COMMUNITY)
Admission: RE | Admit: 2017-05-03 | Payer: BC Managed Care – PPO | Source: Ambulatory Visit | Admitting: Gastroenterology

## 2017-05-03 ENCOUNTER — Encounter (HOSPITAL_COMMUNITY): Admission: RE | Payer: Self-pay | Source: Ambulatory Visit

## 2017-05-03 SURGERY — COLONOSCOPY
Anesthesia: Moderate Sedation

## 2017-06-01 ENCOUNTER — Encounter: Payer: Self-pay | Admitting: Gastroenterology

## 2017-06-01 ENCOUNTER — Other Ambulatory Visit: Payer: Self-pay

## 2017-06-01 ENCOUNTER — Ambulatory Visit (INDEPENDENT_AMBULATORY_CARE_PROVIDER_SITE_OTHER): Payer: BC Managed Care – PPO | Admitting: Gastroenterology

## 2017-06-01 VITALS — BP 121/68 | HR 73 | Temp 97.3°F | Ht 62.0 in | Wt 166.8 lb

## 2017-06-01 DIAGNOSIS — Z1211 Encounter for screening for malignant neoplasm of colon: Secondary | ICD-10-CM

## 2017-06-01 DIAGNOSIS — K5900 Constipation, unspecified: Secondary | ICD-10-CM | POA: Diagnosis not present

## 2017-06-01 NOTE — Assessment & Plan Note (Signed)
First ever screening colonoscopy in near future.  I have discussed the risks, alternatives, benefits with regards to but not limited to the risk of reaction to medication, bleeding, infection, perforation and the patient is agreeable to proceed. Written consent to be obtained.  

## 2017-06-01 NOTE — Assessment & Plan Note (Signed)
Continue current bowel regimen. Preceding colonoscopy, she can increase miralax to bid and/or add bisacodyl 10mg  daily for 3 days before. Continue to increase daily water and exercise.

## 2017-06-01 NOTE — Progress Notes (Addendum)
REVIEWED-NO ADDITIONAL RECOMMENDATIONS.  Primary Care Physician:  Harlan Stains, MD  Primary Gastroenterologist:  Barney Drain, MD   Chief Complaint  Patient presents with  . Colonoscopy    reschedule screening tcs; no problems    HPI:  Michelle Maxwell is a 46 y.o. female here To reschedule her colonoscopy. She was seen back in April for the same. She has chronic constipation and GERD. Chronically on MiraLAX and previously Nexium. She was concerned about kidney injury and had stopped her MiraLAX and Nexium.. Omeprazole 20 mg daily as needed. When I saw her last she was having a bowel movement every 5-6 days. She was started on fiber choice 2 daily. Advised to begin MiraLAX 17 g daily until regular bowel movements and then tried to use only as needed. Increase dietary fiber. Remote EGD per patient, hiatal hernia. She had to reschedule colonoscopy because she could not get off of work.  Miralax daily right now. FiberChoice had been working, now home for summer and forgets to take it. Still taking PPI prn only. No dysphagia. No n/v, abdominal pain. No melena, brbpr.    Current Outpatient Prescriptions  Medication Sig Dispense Refill  . cholecalciferol (VITAMIN D) 1000 UNITS tablet Take 2,000 Units by mouth daily.    Marland Kitchen omeprazole (PRILOSEC) 20 MG capsule 20 mg as needed.     Marland Kitchen OVER THE COUNTER MEDICATION Fiber tablet 1-2 a day as needed    . polyethylene glycol powder (MIRALAX) powder Take 17 g by mouth daily.     . polyethylene glycol-electrolytes (TRILYTE) 420 g solution Take 4,000 mLs by mouth as directed. (Patient not taking: Reported on 06/01/2017) 4000 mL 0   No current facility-administered medications for this visit.     Allergies as of 06/01/2017  . (No Known Allergies)    Past Medical History:  Diagnosis Date  . Bilateral groin pain 06/30/2016  . Bloating 06/19/2013  . Constipation 06/19/2013  . Fibroid 07/06/2016  . GERD (gastroesophageal reflux disease)   . Hematuria  10/15/2015  . Hot flashes 12/25/2014  . Missed periods 12/25/2014  . Perimenopause 12/25/2014  . Thyroid nodule 06/19/2013  . Trichomonal vaginitis 10/15/2015  . Vaginal discharge 10/15/2015  . Vaginal itching 10/15/2015  . Vaginal odor 10/15/2015    Past Surgical History:  Procedure Laterality Date  . CESAREAN SECTION    . WISDOM TOOTH EXTRACTION      Family History  Problem Relation Age of Onset  . Cancer Mother 53       breast  . Cancer Father        brain  . Colon cancer Neg Hx     Social History   Social History  . Marital status: Married    Spouse name: N/A  . Number of children: N/A  . Years of education: N/A   Occupational History  . Not on file.   Social History Main Topics  . Smoking status: Never Smoker  . Smokeless tobacco: Never Used  . Alcohol use No  . Drug use: No  . Sexual activity: Yes    Birth control/ protection: Other-see comments     Comment: vasectomy   Other Topics Concern  . Not on file   Social History Narrative  . No narrative on file      ROS:  General: Negative for anorexia, weight loss, fever, chills, fatigue, weakness. Eyes: Negative for vision changes.  ENT: Negative for hoarseness, difficulty swallowing , nasal congestion. CV: Negative for chest pain, angina, palpitations, dyspnea on  exertion, peripheral edema.  Respiratory: Negative for dyspnea at rest, dyspnea on exertion, cough, sputum, wheezing.  GI: See history of present illness. GU:  Negative for dysuria, hematuria, urinary incontinence, urinary frequency, nocturnal urination.  MS: Negative for joint pain, low back pain.  Derm: Negative for rash or itching.  Neuro: Negative for weakness, abnormal sensation, seizure, frequent headaches, memory loss, confusion.  Psych: Negative for anxiety, depression, suicidal ideation, hallucinations.  Endo: Negative for unusual weight change.  Heme: Negative for bruising or bleeding. Allergy: Negative for rash or hives.     Physical Examination:  BP 121/68   Pulse 73   Temp 97.3 F (36.3 C) (Oral)   Ht 5\' 2"  (1.575 m)   Wt 166 lb 12.8 oz (75.7 kg)   BMI 30.51 kg/m    General: Well-nourished, well-developed in no acute distress.  Head: Normocephalic, atraumatic.   Eyes: Conjunctiva pink, no icterus. Mouth: Oropharyngeal mucosa moist and pink , no lesions erythema or exudate. Neck: Supple without thyromegaly, masses, or lymphadenopathy.  Lungs: Clear to auscultation bilaterally.  Heart: Regular rate and rhythm, no murmurs rubs or gallops.  Abdomen: Bowel sounds are normal, nontender, nondistended, no hepatosplenomegaly or masses, no abdominal bruits or    hernia , no rebound or guarding.   Rectal: not performed Extremities: No lower extremity edema. No clubbing or deformities.  Neuro: Alert and oriented x 4 , grossly normal neurologically.  Skin: Warm and dry, no rash or jaundice.   Psych: Alert and cooperative, normal mood and affect.

## 2017-06-01 NOTE — Progress Notes (Signed)
cc'ed to pcp °

## 2017-06-01 NOTE — Patient Instructions (Addendum)
1. The week preceding your colonoscopy, make sure you are moving her bowels well. You may take additional MiraLAX up to twice daily.  2. You will need to buy bisacodyl tablets to go with your bowel prep. You can also take 2 tablets (10mg ) daily for 3 days before your colon prep starts if you are concerned about being constipated before your procedure.

## 2017-06-02 ENCOUNTER — Encounter: Payer: Self-pay | Admitting: Gastroenterology

## 2017-06-02 NOTE — Progress Notes (Signed)
EGD 2010 at [facility], small sliding hiatal hernia.

## 2017-06-28 ENCOUNTER — Ambulatory Visit (INDEPENDENT_AMBULATORY_CARE_PROVIDER_SITE_OTHER): Payer: BC Managed Care – PPO | Admitting: "Endocrinology

## 2017-06-28 ENCOUNTER — Encounter: Payer: Self-pay | Admitting: "Endocrinology

## 2017-06-28 VITALS — BP 116/74 | HR 69 | Ht 62.0 in | Wt 168.6 lb

## 2017-06-28 DIAGNOSIS — E042 Nontoxic multinodular goiter: Secondary | ICD-10-CM

## 2017-06-28 LAB — TSH: TSH: 2.58 m[IU]/L

## 2017-06-28 LAB — T3, FREE: T3 FREE: 2.8 pg/mL (ref 2.3–4.2)

## 2017-06-28 LAB — T4, FREE: Free T4: 1.1 ng/dL (ref 0.8–1.8)

## 2017-06-28 NOTE — Progress Notes (Signed)
Subjective:    Patient ID: Michelle Maxwell, female    DOB: 1971/06/26, PCP Harlan Stains, MD   Past Medical History:  Diagnosis Date  . Bilateral groin pain 06/30/2016  . Bloating 06/19/2013  . Constipation 06/19/2013  . Fibroid 07/06/2016  . GERD (gastroesophageal reflux disease)   . Hematuria 10/15/2015  . Hot flashes 12/25/2014  . Missed periods 12/25/2014  . Perimenopause 12/25/2014  . Thyroid nodule 06/19/2013  . Trichomonal vaginitis 10/15/2015  . Vaginal discharge 10/15/2015  . Vaginal itching 10/15/2015  . Vaginal odor 10/15/2015   Past Surgical History:  Procedure Laterality Date  . CESAREAN SECTION    . ESOPHAGOGASTRODUODENOSCOPY  02/2009   Rehman: small sliding hiatal hernia  . WISDOM TOOTH EXTRACTION     Social History   Social History  . Marital status: Married    Spouse name: N/A  . Number of children: N/A  . Years of education: N/A   Social History Main Topics  . Smoking status: Never Smoker  . Smokeless tobacco: Never Used  . Alcohol use No  . Drug use: No  . Sexual activity: Yes    Birth control/ protection: Other-see comments     Comment: vasectomy   Other Topics Concern  . None   Social History Narrative  . None   Outpatient Encounter Prescriptions as of 06/28/2017  Medication Sig  . cholecalciferol (VITAMIN D) 1000 UNITS tablet Take 2,000 Units by mouth daily.  Marland Kitchen esomeprazole (NEXIUM) 20 MG packet Take 20 mg by mouth daily before breakfast.  . naproxen sodium (ANAPROX) 220 MG tablet Take 220 mg by mouth 2 (two) times daily with a meal.  . polyethylene glycol powder (MIRALAX) powder Take 17 g by mouth daily.   Marland Kitchen omeprazole (PRILOSEC) 20 MG capsule Take 20 mg by mouth daily as needed.   . [DISCONTINUED] Calcium Polycarbophil (FIBER-CAPS PO) Take 1-2 capsules by mouth daily as needed.  . [DISCONTINUED] sucralfate (CARAFATE) 1 g tablet Take 1 g by mouth 4 (four) times daily -  with meals and at bedtime.   No facility-administered encounter  medications on file as of 06/28/2017.    ALLERGIES: No Known Allergies  VACCINATION STATUS:  There is no immunization history on file for this patient.  HPI 46 year old female patient with medical history as above. She is being seen in consultation for history of multinodular goiter requested by her PCP Harlan Stains, MD. - She was known to have multinodular goiter since at least from 2013, when ultrasound of her thyroid showed 1 cm right lobe nodule with microcalcifications. She underwent fine-needle aspiration and December 2013 which showed non-neoplastic goiter. -She did not require any thyroid intervention over the years. Her most recent thyroid ultrasound from August 2017 showed right lobe 4.8 cm with 1.3 cm nodule (previously biopsied when he was 1.0 cm), left lobe with 4.2 cm longest dimension with 2 nodules measuring 1.5 cm and 1.3 cm- his left lobe nodule swear first noticed in 2013 when they were less than 10 mm. - She denies dysphagia, shortness of breath, nor voice change. She denies any exposure to neck radiation. She denies any family history of thyroid cancer. She is not on any antithyroid medications nor any thyroid hormone supplements. - She does not have recent thyroid function test to review.  Review of Systems  Constitutional: no weight gain/loss, no fatigue, no subjective hyperthermia, no subjective hypothermia Eyes: no blurry vision, no xerophthalmia ENT: no sore throat, no nodules palpated in throat, no dysphagia/odynophagia, no  hoarseness Cardiovascular: no Chest Pain, no Shortness of Breath, no palpitations, no leg swelling Respiratory: no cough, no SOB Gastrointestinal: no Nausea/Vomiting/Diarhhea Musculoskeletal: no muscle/joint aches Skin: no rashes Neurological: no tremors, no numbness, no tingling, no dizziness Psychiatric: no depression, no anxiety  Objective:    BP 116/74 (BP Location: Right Arm, Patient Position: Sitting, Cuff Size: Large)   Pulse 69    Ht 5\' 2"  (1.575 m)   Wt 168 lb 9.6 oz (76.5 kg)   SpO2 100%   BMI 30.84 kg/m   Wt Readings from Last 3 Encounters:  06/28/17 168 lb 9.6 oz (76.5 kg)  06/01/17 166 lb 12.8 oz (75.7 kg)  03/17/17 163 lb 12.8 oz (74.3 kg)    Physical Exam  Constitutional: obese for height, not in acute distress, normal state of mind Eyes: PERRLA, EOMI, no exophthalmos ENT: moist mucous membranes, palpable thyromegaly, no cervical lymphadenopathy Cardiovascular: normal precordial activity, Regular Rate and Rhythm, no Murmur/Rubs/Gallops Respiratory:  adequate breathing efforts, no gross chest deformity, Clear to auscultation bilaterally Gastrointestinal: abdomen soft, Non -tender, No distension, Bowel Sounds present Musculoskeletal: no gross deformities, strength intact in all four extremities Skin: moist, warm, no rashes Neurological: no tremor with outstretched hands, Deep tendon reflexes normal in all four extremities.  CMP ( most recent) CMP     Component Value Date/Time   NA 137 06/19/2013 1445   K 5.1 06/19/2013 1445   CL 104 06/19/2013 1445   CO2 30 06/19/2013 1445   GLUCOSE 72 06/19/2013 1445   BUN 7 06/19/2013 1445   CREATININE 0.80 06/19/2013 1445   CALCIUM 9.7 06/19/2013 1445   PROT 7.3 06/19/2013 1445   ALBUMIN 4.1 06/19/2013 1445   AST 16 06/19/2013 1445   ALT 17 06/19/2013 1445   ALKPHOS 76 06/19/2013 1445   BILITOT 0.4 06/19/2013 1445      Lipid Panel ( most recent) Lipid Panel     Component Value Date/Time   CHOL 138 06/19/2013 1445   TRIG 38 06/19/2013 1445   HDL 52 06/19/2013 1445   CHOLHDL 2.7 06/19/2013 1445   VLDL 8 06/19/2013 1445   LDLCALC 78 06/19/2013 1445     No recent thyroid function test to review.    Assessment & Plan:   1. Multinodular goiter - Patient is being seen at the kind request of her PMD Harlan Stains, MD. - I have reviewed her available thyroid records and clinically evaluated this patient. - Based on the review is, she has  euthyroid multinodular goiter with stable or slowly growing bilateral thyroid nodules evident on a series of thyroid sonograms done between 2013 and 2017. - Fine-needle aspiration cytology report of the  1.3 cm right lobe nodule from 2013 was significant for nonneoplastic goiter. - This is the nodule described on most recent ultrasound from August 2017.  - Accordingly, she will not be considered for fine needle aspiration at this time. However, I will plan to obtain surveillance thyroid ultrasound 1 year from now and depending on the size and future of the nodules she will be re- approached if FNA is needed. Suspicion for thyroid malignancy is low at this time.  - I advised patient to maintain close follow up with Harlan Stains, MD for primary care needs. Follow up plan: Return in about 3 weeks (around 07/19/2017) for labs today.  Glade Lloyd, MD Phone: [phone]  Fax: [phone]   06/28/2017, 5:49 PM

## 2017-06-29 LAB — THYROID PEROXIDASE ANTIBODY: THYROID PEROXIDASE ANTIBODY: 1 [IU]/mL (ref <9)

## 2017-06-29 LAB — THYROGLOBULIN ANTIBODY: Thyroglobulin Ab: <1 IU/mL (ref <2)

## 2017-06-30 ENCOUNTER — Ambulatory Visit (HOSPITAL_COMMUNITY)
Admission: RE | Admit: 2017-06-30 | Discharge: 2017-06-30 | Disposition: A | Discharge status: Home / Self Care | Payer: BC Managed Care – PPO | Source: Ambulatory Visit | Attending: Gastroenterology | Admitting: Gastroenterology

## 2017-06-30 ENCOUNTER — Encounter (HOSPITAL_COMMUNITY)
Admission: RE | Disposition: A | Discharge status: Home / Self Care | Payer: Self-pay | Source: Ambulatory Visit | Attending: Gastroenterology

## 2017-06-30 ENCOUNTER — Encounter (HOSPITAL_COMMUNITY): Payer: Self-pay | Admitting: *Deleted

## 2017-06-30 DIAGNOSIS — K648 Other hemorrhoids: Secondary | ICD-10-CM | POA: Insufficient documentation

## 2017-06-30 DIAGNOSIS — Z79899 Other long term (current) drug therapy: Secondary | ICD-10-CM | POA: Diagnosis not present

## 2017-06-30 DIAGNOSIS — K219 Gastro-esophageal reflux disease without esophagitis: Secondary | ICD-10-CM | POA: Diagnosis not present

## 2017-06-30 DIAGNOSIS — K449 Diaphragmatic hernia without obstruction or gangrene: Secondary | ICD-10-CM | POA: Insufficient documentation

## 2017-06-30 DIAGNOSIS — Z1212 Encounter for screening for malignant neoplasm of rectum: Secondary | ICD-10-CM

## 2017-06-30 DIAGNOSIS — Z1211 Encounter for screening for malignant neoplasm of colon: Secondary | ICD-10-CM | POA: Insufficient documentation

## 2017-06-30 HISTORY — PX: COLONOSCOPY: SHX5424

## 2017-06-30 SURGERY — COLONOSCOPY
Anesthesia: Moderate Sedation

## 2017-06-30 MED ORDER — MIDAZOLAM HCL 5 MG/5ML IJ SOLN
INTRAMUSCULAR | Status: AC
  Filled 2017-06-30: qty 10

## 2017-06-30 MED ORDER — MIDAZOLAM HCL 5 MG/5ML IJ SOLN
INTRAMUSCULAR | Status: DC | PRN
  Administered 2017-06-30: 1 mg via INTRAVENOUS

## 2017-06-30 MED ORDER — SODIUM CHLORIDE 0.9 % IV SOLN
INTRAVENOUS | Status: DC
  Administered 2017-06-30: 1000 mL via INTRAVENOUS

## 2017-06-30 MED ORDER — MIDAZOLAM HCL 5 MG/5ML IJ SOLN
INTRAMUSCULAR | Status: DC | PRN
  Administered 2017-06-30 (×2): 2 mg via INTRAVENOUS

## 2017-06-30 MED ORDER — MEPERIDINE HCL 100 MG/ML IJ SOLN
INTRAMUSCULAR | Status: AC
  Filled 2017-06-30: qty 2

## 2017-06-30 MED ORDER — MEPERIDINE HCL 100 MG/ML IJ SOLN
INTRAMUSCULAR | Status: DC | PRN
Start: 2017-06-30 — End: 2017-06-30
  Administered 2017-06-30 (×2): 25 mg via INTRAVENOUS

## 2017-06-30 MED ORDER — STERILE WATER FOR IRRIGATION IR SOLN
Status: DC | PRN
  Administered 2017-06-30: 2.5 mL

## 2017-06-30 NOTE — Op Note (Signed)
[facility] Patient Name: Michelle Maxwell Procedure Date: 06/30/2017 12:56 PM MRN: [id number] Date of Birth: Jul 19, 1971 Attending MD: Barney Drain , MD CSN: [id number] Age: 46 Admit Type: Outpatient Procedure:                Colonoscopy, SCREENING Indications:              Screening for colorectal malignant neoplasm Providers:                Barney Drain, MD, Lurline Del, RN, Randa Spike,                            Technician Referring MD:             Harlan Stains Medicines:                Meperidine 50 mg IV, Midazolam 5 mg IV Complications:            No immediate complications. Estimated Blood Loss:     Estimated blood loss: none. Procedure:                Pre-Anesthesia Assessment:                           - Prior to the procedure, a History and Physical                            was performed, and patient medications and                            allergies were reviewed. The patient's tolerance of                            previous anesthesia was also reviewed. The risks                            and benefits of the procedure and the sedation                            options and risks were discussed with the patient.                            All questions were answered, and informed consent                            was obtained. Prior Anticoagulants: The patient has                            taken naproxen, last dose was more than 4 weeks                            prior to procedure. ASA Grade Assessment: II - A                            patient with mild systemic disease. After reviewing  the risks and benefits, the patient was deemed in                            satisfactory condition to undergo the procedure.                            After obtaining informed consent, the colonoscope                            was passed under direct vision. Throughout the                            procedure, the patient's blood pressure,  pulse, and                            oxygen saturations were monitored continuously. The                            EC-[id number] ([id number]) scope was introduced through                            the anus and advanced to the the cecum, identified                            by appendiceal orifice and ileocecal valve. The                            colonoscopy was technically difficult and complex                            due to significant looping. Successful completion                            of the procedure was aided by increasing the dose                            of sedation medication, straightening and                            shortening the scope to obtain bowel loop reduction                            and COLOWRAP. The patient tolerated the procedure                            fairly well. The quality of the bowel preparation                            was excellent. The ileocecal valve, appendiceal                            orifice, and rectum were photographed. Scope In: 1:27:56 PM Scope Out: 1:41:59 PM Scope Withdrawal Time: 0 hours 9 minutes 41 seconds  Total Procedure Duration: 0 hours 14 minutes 3 seconds  Findings:      The recto-sigmoid colon and sigmoid colon were moderately redundant.      The exam was otherwise without abnormality.      Internal hemorrhoids were found during retroflexion. The hemorrhoids       were small. Impression:               - Redundant colon.                           - The examination was otherwise normal.                           - Internal hemorrhoids. Moderate Sedation:      Moderate (conscious) sedation was administered by the endoscopy nurse       and supervised by the endoscopist. The following parameters were       monitored: oxygen saturation, heart rate, blood pressure, and response       to care. Total physician intraservice time was 25 minutes. Recommendation:           - Repeat colonoscopy in 10 years for surveillance.                            - High fiber diet.                           - Continue present medications.                           - Patient has a contact number available for                            emergencies. The signs and symptoms of potential                            delayed complications were discussed with the                            patient. Return to normal activities tomorrow.                            Written discharge instructions were provided to the                            patient. Procedure Code(s):        --- Professional ---                           [phone], Colonoscopy, flexible; diagnostic, including                            collection of specimen(s) by brushing or washing,                            when performed (separate procedure)  44010, Moderate sedation services provided by the                            same physician or other qualified health care                            professional performing the diagnostic or                            therapeutic service that the sedation supports,                            requiring the presence of an independent trained                            observer to assist in the monitoring of the                            patient's level of consciousness and physiological                            status; initial 15 minutes of intraservice time,                            patient age 70 years or older                           [phone], Moderate sedation services; each additional                            15 minutes intraservice time Diagnosis Code(s):        --- Professional ---                           Z12.11, Encounter for screening for malignant                            neoplasm of colon                           K64.8, Other hemorrhoids                           Q43.8, Other specified congenital malformations of                            intestine CPT copyright 2016 American  [employer] Association. All rights reserved. The codes documented in this report are preliminary and upon coder review may  be revised to meet current compliance requirements. Barney Drain, MD Barney Drain, MD 06/30/2017 1:54:32 PM This report has been signed electronically. Number of Addenda: 0

## 2017-06-30 NOTE — Discharge Instructions (Signed)
You have internal hemorrhoids. YOU DID NOT HAVE ANY POLYPS.   DRINK WATER TO KEEP YOUR URINE LIGHT YELLOW.  CONTINUE YOUR WEIGHT LOSS EFFORTS.  WHILE I DO NOT WANT TO ALARM YOU, YOUR BODY MASS INDEX (BMI) IS OVER 30 WHICH MEANS YOU ARE OBESE. Obesity activates cancer genes. OBESITY IS ASSOCIATED WITH AN INCREASE RISK FOR ALL CANCERS, INCLUDING ESOPHAGEAL AND COLON CANCER. A weight of 160 LBS  WILL GET YOUR BMI UNDER 30.  FOLLOW A HIGH FIBER DIET. AVOID ITEMS THAT CAUSE BLOATING. SEE INFO BELOW.  USE PREPARATION H FOUR TIMES  A DAY IF NEEDED TO RELIEVE RECTAL PAIN/PRESSURE/BLEEDING.   Next colonoscopy in 10 years.   Colonoscopy Care After Read the instructions outlined below and refer to this sheet in the next week. These discharge instructions provide you with general information on caring for yourself after you leave the hospital. While your treatment has been planned according to the most current medical practices available, unavoidable complications occasionally occur. If you have any problems or questions after discharge, call DR. Quatavious Rossa, [phone].  ACTIVITY  You may resume your regular activity, but move at a slower pace for the next 24 hours.   Take frequent rest periods for the next 24 hours.   Walking will help get rid of the air and reduce the bloated feeling in your belly (abdomen).   No driving for 24 hours (because of the medicine (anesthesia) used during the test).   You may shower.   Do not sign any important legal documents or operate any machinery for 24 hours (because of the anesthesia used during the test).    NUTRITION  Drink plenty of fluids.   You may resume your normal diet as instructed by your doctor.   Begin with a light meal and progress to your normal diet. Heavy or fried foods are harder to digest and may make you feel sick to your stomach (nauseated).   Avoid alcoholic beverages for 24 hours or as instructed.    MEDICATIONS  You may  resume your normal medications.   WHAT YOU CAN EXPECT TODAY  Some feelings of bloating in the abdomen.   Passage of more gas than usual.   Spotting of blood in your stool or on the toilet paper  .  IF YOU HAD POLYPS REMOVED DURING THE COLONOSCOPY:  Eat a soft diet IF YOU HAVE NAUSEA, BLOATING, ABDOMINAL PAIN, OR VOMITING.    FINDING OUT THE RESULTS OF YOUR TEST Not all test results are available during your visit. DR. Oneida Alar WILL CALL YOU WITHIN 7 DAYS OF YOUR PROCEDUE WITH YOUR RESULTS. Do not assume everything is normal if you have not heard from DR. Kolbie Clarkston IN ONE WEEK, CALL HER OFFICE AT [phone].  SEEK IMMEDIATE MEDICAL ATTENTION AND CALL THE OFFICE: [phone] IF:  You have more than a spotting of blood in your stool.   Your belly is swollen (abdominal distention).   You are nauseated or vomiting.   You have a temperature over 101F.   You have abdominal pain or discomfort that is severe or gets worse throughout the day.  High-Fiber Diet A high-fiber diet changes your normal diet to include more whole grains, legumes, fruits, and vegetables. Changes in the diet involve replacing refined carbohydrates with unrefined foods. The calorie level of the diet is essentially unchanged. The Dietary Reference Intake (recommended amount) for adult males is 38 grams per day. For adult females, it is 25 grams per day. Pregnant and lactating women should consume  28 grams of fiber per day. Fiber is the intact part of a plant that is not broken down during digestion. Functional fiber is fiber that has been isolated from the plant to provide a beneficial effect in the body. PURPOSE  Increase stool bulk.   Ease and regulate bowel movements.   Lower cholesterol.   REDUCE RISK OF COLON CANCER  INDICATIONS THAT YOU NEED MORE FIBER  Constipation and hemorrhoids.   Uncomplicated diverticulosis (intestine condition) and irritable bowel syndrome.   Weight management.   As a  protective measure against hardening of the arteries (atherosclerosis), diabetes, and cancer.   GUIDELINES FOR INCREASING FIBER IN THE DIET  Start adding fiber to the diet slowly. A gradual increase of about 5 more grams (2 slices of whole-wheat bread, 2 servings of most fruits or vegetables, or 1 bowl of high-fiber cereal) per day is best. Too rapid an increase in fiber may result in constipation, flatulence, and bloating.   Drink enough water and fluids to keep your urine clear or pale yellow. Water, juice, or caffeine-free drinks are recommended. Not drinking enough fluid may cause constipation.   Eat a variety of high-fiber foods rather than one type of fiber.   Try to increase your intake of fiber through using high-fiber foods rather than fiber pills or supplements that contain small amounts of fiber.   The goal is to change the types of food eaten. Do not supplement your present diet with high-fiber foods, but replace foods in your present diet.   INCLUDE A VARIETY OF FIBER SOURCES  Replace refined and processed grains with whole grains, canned fruits with fresh fruits, and incorporate other fiber sources. White rice, white breads, and most bakery goods contain little or no fiber.   Brown whole-grain rice, buckwheat oats, and many fruits and vegetables are all good sources of fiber. These include: broccoli, Brussels sprouts, cabbage, cauliflower, beets, sweet potatoes, white potatoes (skin on), carrots, tomatoes, eggplant, squash, berries, fresh fruits, and dried fruits.   Cereals appear to be the richest source of fiber. Cereal fiber is found in whole grains and bran. Bran is the fiber-rich outer coat of cereal grain, which is largely removed in refining. In whole-grain cereals, the bran remains. In breakfast cereals, the largest amount of fiber is found in those with "bran" in their names. The fiber content is sometimes indicated on the label.   You may need to include additional  fruits and vegetables each day.   In baking, for 1 cup white flour, you may use the following substitutions:   1 cup whole-wheat flour minus 2 tablespoons.   1/2 cup white flour plus 1/2 cup whole-wheat flour.   Hemorrhoids Hemorrhoids are dilated (enlarged) veins around the rectum. Sometimes clots will form in the veins. This makes them swollen and painful. These are called thrombosed hemorrhoids. Causes of hemorrhoids include:  Constipation.   Straining to have a bowel movement.   HEAVY LIFTING  HOME CARE INSTRUCTIONS  Eat a well balanced diet and drink 6 to 8 glasses of water every day to avoid constipation. You may also use a bulk laxative.   Avoid straining to have bowel movements.   Keep anal area dry and clean.   Do not use a donut shaped pillow or sit on the toilet for long periods. This increases blood pooling and pain.   Move your bowels when your body has the urge; this will require less straining and will decrease pain and pressure.

## 2017-06-30 NOTE — H&P (Signed)
Primary Care Physician:  Harlan Stains, MD Primary Gastroenterologist:  Dr. Oneida Alar  Pre-Procedure History & Physical: HPI:  Michelle Maxwell is a 46 y.o. female here for [place].  Past Medical History:  Diagnosis Date  . Bilateral groin pain 06/30/2016  . Bloating 06/19/2013  . Constipation 06/19/2013  . Fibroid 07/06/2016  . GERD (gastroesophageal reflux disease)   . Hematuria 10/15/2015  . Hot flashes 12/25/2014  . Missed periods 12/25/2014  . Perimenopause 12/25/2014  . Thyroid nodule 06/19/2013  . Trichomonal vaginitis 10/15/2015  . Vaginal discharge 10/15/2015  . Vaginal itching 10/15/2015  . Vaginal odor 10/15/2015    Past Surgical History:  Procedure Laterality Date  . CESAREAN SECTION    . ESOPHAGOGASTRODUODENOSCOPY  02/2009   Rehman: small sliding hiatal hernia  . WISDOM TOOTH EXTRACTION      Prior to Admission medications   Medication Sig Start Date End Date Taking? Authorizing Provider  cholecalciferol (VITAMIN D) 1000 UNITS tablet Take 2,000 Units by mouth daily.   Yes [provider]  esomeprazole (NEXIUM) 20 MG packet Take 20 mg by mouth daily before breakfast.   Yes [provider]  naproxen sodium (ANAPROX) 220 MG tablet Take 220 mg by mouth 2 (two) times daily with a meal.   Yes [provider]  omeprazole (PRILOSEC) 20 MG capsule Take 20 mg by mouth daily as needed.  06/22/15  Yes [provider]  polyethylene glycol powder (MIRALAX) powder Take 17 g by mouth daily.    Yes [provider]    Allergies as of 06/01/2017  . (No Known Allergies)    Family History  Problem Relation Age of Onset  . Cancer Mother 36       breast  . Cancer Father        brain  . Colon cancer Neg Hx     Social History   Social History  . Marital status: Married    Spouse name: N/A  . Number of children: N/A  . Years of education: N/A   Occupational History  . Not on file.   Social History Main Topics  . Smoking  status: Never Smoker  . Smokeless tobacco: Never Used  . Alcohol use No  . Drug use: No  . Sexual activity: Yes    Birth control/ protection: Other-see comments     Comment: vasectomy   Other Topics Concern  . Not on file   Social History Narrative  . No narrative on file    Review of Systems: See HPI, otherwise negative ROS   Physical Exam: BP 113/75   Pulse 74   Temp 98.5 F (36.9 C) (Oral)   Resp 13   Ht 5\' 2"  (1.575 m)   Wt 168 lb (76.2 kg)   LMP 05/19/2017 (Exact Date)   SpO2 100%   BMI 30.73 kg/m  General:   Alert,  pleasant and cooperative in NAD Head:  Normocephalic and atraumatic. Neck:  Supple; Lungs:  Clear throughout to auscultation.    Heart:  Regular rate and rhythm. Abdomen:  Soft, nontender and nondistended. Normal bowel sounds, without guarding, and without rebound.   Neurologic:  Alert and  oriented x4;  grossly normal neurologically.  Impression/Plan:    COLON CANCER SCREENING.  PLAN:  1. TCS TODAY. DISCUSSED PROCEDURE, BENEFITS, & RISKS: < 1% chance of medication reaction, bleeding, perforation, or rupture of spleen/liver.

## 2017-07-02 ENCOUNTER — Encounter (HOSPITAL_COMMUNITY): Payer: Self-pay | Admitting: Gastroenterology

## 2017-07-14 ENCOUNTER — Ambulatory Visit (HOSPITAL_COMMUNITY): Payer: BC Managed Care – PPO

## 2017-07-19 ENCOUNTER — Ambulatory Visit (INDEPENDENT_AMBULATORY_CARE_PROVIDER_SITE_OTHER): Payer: BC Managed Care – PPO | Admitting: "Endocrinology

## 2017-07-19 ENCOUNTER — Encounter: Payer: Self-pay | Admitting: "Endocrinology

## 2017-07-19 VITALS — BP 138/83 | HR 73

## 2017-07-19 DIAGNOSIS — E042 Nontoxic multinodular goiter: Secondary | ICD-10-CM | POA: Diagnosis not present

## 2017-07-19 NOTE — Progress Notes (Signed)
Subjective:    Patient ID: Michelle Maxwell, female    DOB: 1971-04-26, PCP Harlan Stains, MD   Past Medical History:  Diagnosis Date  . Bilateral groin pain 06/30/2016  . Bloating 06/19/2013  . Constipation 06/19/2013  . Fibroid 07/06/2016  . GERD (gastroesophageal reflux disease)   . Hematuria 10/15/2015  . Hot flashes 12/25/2014  . Missed periods 12/25/2014  . Perimenopause 12/25/2014  . Thyroid nodule 06/19/2013  . Trichomonal vaginitis 10/15/2015  . Vaginal discharge 10/15/2015  . Vaginal itching 10/15/2015  . Vaginal odor 10/15/2015   Past Surgical History:  Procedure Laterality Date  . CESAREAN SECTION    . COLONOSCOPY N/A 06/30/2017   Procedure: COLONOSCOPY;  Surgeon: Danie Binder, MD;  Location: [facility];  Service: Endoscopy;  Laterality: N/A;  1:00PM  . ESOPHAGOGASTRODUODENOSCOPY  02/2009   Rehman: small sliding hiatal hernia  . WISDOM TOOTH EXTRACTION     Social History   Social History  . Marital status: Married    Spouse name: N/A  . Number of children: N/A  . Years of education: N/A   Social History Main Topics  . Smoking status: Never Smoker  . Smokeless tobacco: Never Used  . Alcohol use No  . Drug use: No  . Sexual activity: Yes    Birth control/ protection: Other-see comments     Comment: vasectomy   Other Topics Concern  . Not on file   Social History Narrative  . No narrative on file   Outpatient Encounter Prescriptions as of 07/19/2017  Medication Sig  . cholecalciferol (VITAMIN D) 1000 UNITS tablet Take 2,000 Units by mouth daily.  Marland Kitchen esomeprazole (NEXIUM) 20 MG packet Take 20 mg by mouth daily before breakfast.  . naproxen sodium (ANAPROX) 220 MG tablet Take 220 mg by mouth 2 (two) times daily with a meal.  . omeprazole (PRILOSEC) 20 MG capsule Take 20 mg by mouth daily as needed.   . polyethylene glycol powder (MIRALAX) powder Take 17 g by mouth daily.    No facility-administered encounter medications on file as of 07/19/2017.     ALLERGIES: No Known Allergies  VACCINATION STATUS:  There is no immunization history on file for this patient.  HPI 46 year old female patient with medical history as above. She is being seen in follow-up for history of multinodular goiter with new set of thyroid function tests. - She was known to have multinodular goiter since at least from 2013, when ultrasound of her thyroid showed 1 cm right lobe nodule with microcalcifications. She underwent fine-needle aspiration and December 2013 which showed non-neoplastic goiter. -She did not require any thyroid intervention over the years. Her most recent thyroid ultrasound from August 2017 showed right lobe 4.8 cm with 1.3 cm nodule (previously biopsied when he was 1.0 cm), left lobe with 4.2 cm longest dimension with 2 nodules measuring 1.5 cm and 1.3 cm- his left lobe nodule swear first noticed in 2013 when they were less than 10 mm. - She denies dysphagia, shortness of breath, nor voice change. She denies any exposure to neck radiation. She denies any family history of thyroid cancer. She is not on any antithyroid medications nor any thyroid hormone supplements.  Review of Systems  Constitutional: steady weight since last visit,  no fatigue, no subjective hyperthermia, no subjective hypothermia Eyes: no blurry vision, no xerophthalmia ENT: no sore throat, no nodules palpated in throat, no dysphagia/odynophagia, no hoarseness Cardiovascular: no Chest Pain, no Shortness of Breath, no palpitations, no leg swelling  Respiratory: no cough, no SOB Gastrointestinal: no Nausea/Vomiting/Diarhhea Musculoskeletal: no muscle/joint aches Skin: no rashes Neurological: no tremors, no numbness, no tingling, no dizziness Psychiatric: no depression, no anxiety  Objective:    BP 138/83   Pulse 73   Wt Readings from Last 3 Encounters:  06/30/17 168 lb (76.2 kg)  06/28/17 168 lb 9.6 oz (76.5 kg)  06/01/17 166 lb 12.8 oz (75.7 kg)    Physical  Exam  Constitutional: obese for height, not in acute distress, normal state of mind Eyes: PERRLA, EOMI, no exophthalmos ENT: moist mucous membranes, palpable thyromegaly, no cervical lymphadenopathy Cardiovascular: normal precordial activity, Regular Rate and Rhythm, no Murmur/Rubs/Gallops Respiratory:  adequate breathing efforts, no gross chest deformity, Clear to auscultation bilaterally Gastrointestinal: abdomen soft, Non -tender, No distension, Bowel Sounds present Musculoskeletal: no gross deformities, strength intact in all four extremities Skin: moist, warm, no rashes Neurological: no tremor with outstretched hands, Deep tendon reflexes normal in all four extremities.  CMP ( most recent) CMP     Component Value Date/Time   NA 137 06/19/2013 1445   K 5.1 06/19/2013 1445   CL 104 06/19/2013 1445   CO2 30 06/19/2013 1445   GLUCOSE 72 06/19/2013 1445   BUN 7 06/19/2013 1445   CREATININE 0.80 06/19/2013 1445   CALCIUM 9.7 06/19/2013 1445   PROT 7.3 06/19/2013 1445   ALBUMIN 4.1 06/19/2013 1445   AST 16 06/19/2013 1445   ALT 17 06/19/2013 1445   ALKPHOS 76 06/19/2013 1445   BILITOT 0.4 06/19/2013 1445      Lipid Panel ( most recent) Lipid Panel     Component Value Date/Time   CHOL 138 06/19/2013 1445   TRIG 38 06/19/2013 1445   HDL 52 06/19/2013 1445   CHOLHDL 2.7 06/19/2013 1445   VLDL 8 06/19/2013 1445   LDLCALC 78 06/19/2013 1445     Results for DALENE, ROBARDS (MRN [id number]) as of 07/19/2017 15:38  Ref. Range 06/28/2017 09:40  TSH Latest Units: mIU/L 2.58  Triiodothyronine,Free,Serum Latest Ref Range: 2.3 - 4.2 pg/mL 2.8  T4,Free(Direct) Latest Ref Range: 0.8 - 1.8 ng/dL 1.1  Thyroglobulin Ab Latest Ref Range: <2 IU/mL <1  Thyroperoxidase Ab SerPl-aCnc Latest Ref Range: <9 IU/mL 1     Assessment & Plan:   1. Multinodular goiter 2. Euthyroid  - I have reviewed her recent thyroid function tests which are within normal limits, she would not need thyroid  hormone replacement/supplement at this time. -  she has euthyroid multinodular goiter with stable or slowly growing bilateral thyroid nodules evident on a series of thyroid sonograms done between 2013 and 2017. - Fine-needle aspiration cytology report of the  1.3 cm right lobe nodule from 2013 was significant for nonneoplastic goiter. - This is the nodule described on most recent ultrasound from August 2017.  - Accordingly, she will not be considered for fine needle aspiration at this time.  However, I will plan to obtain surveillance thyroid ultrasound 1 year from now and depending on the size and features of the nodules she will be re- approached if FNA is needed. Suspicion for thyroid malignancy is low at this time.  - I advised patient to maintain close follow up with Harlan Stains, MD for primary care needs. Follow up plan: Return in about 1 year (around 07/19/2018) for follow up with pre-visit labs.  Glade Lloyd, MD Phone: [phone]  Fax: [phone]  This note was partially dictated with voice recognition software. Similar sounding words can be transcribed inadequately or may  not  be corrected upon review.  07/19/2017, 3:43 PM

## 2018-01-24 IMAGING — US US SOFT TISSUE HEAD/NECK
1 series · 13 of 25 positions shown · non-contrast
Comparison: 12/26/2014, 11/25/2012, 04/21/2012, biopsy 11/01/2012
of right-sided thyroid nodule

CLINICAL DATA: 44-year-old female with a history of thyroid nodules

EXAM:
THYROID ULTRASOUND
TECHNIQUE: Ultrasound examination of the thyroid gland and adjacent soft
tissues was performed.

[Series 1: us soft tissue head/neck · 0.06mm/px · 13 of 60 slices shown]
[im 1/60]
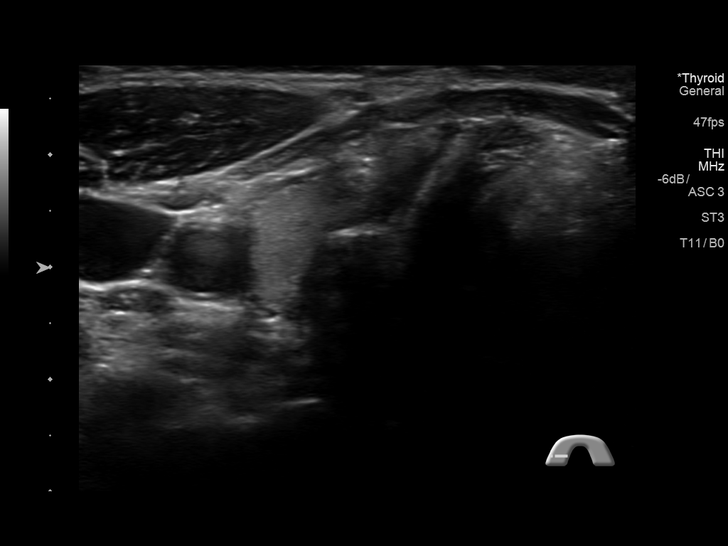
[im 5/60]
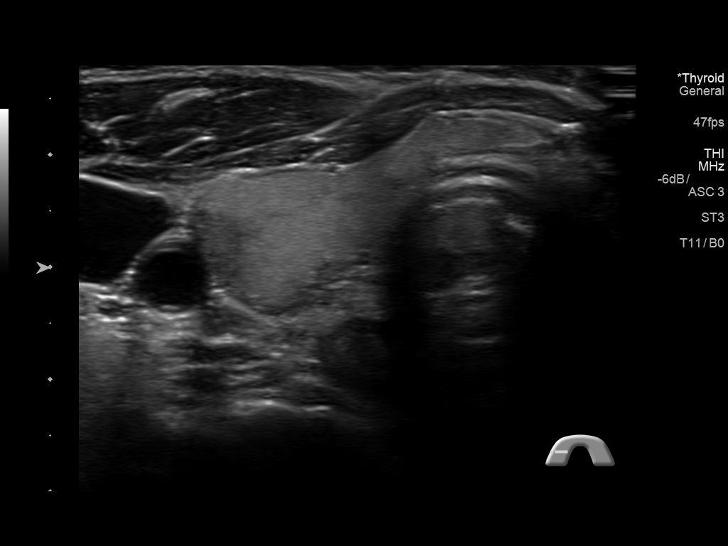
[im 10/60]
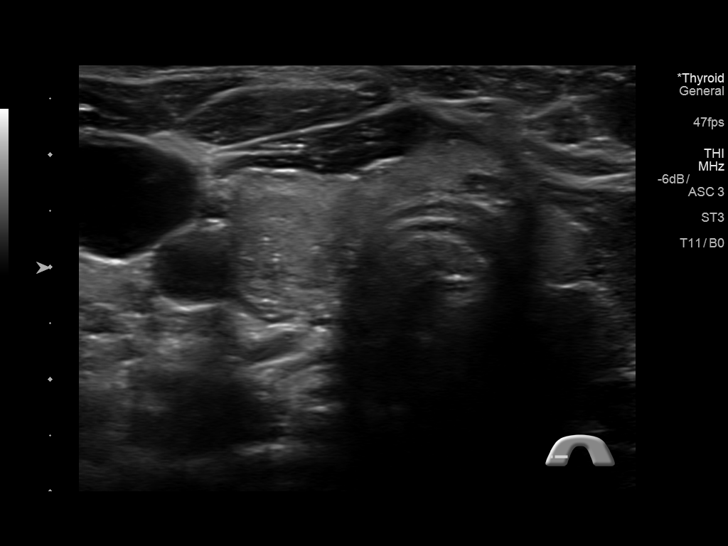
[im 15/60]
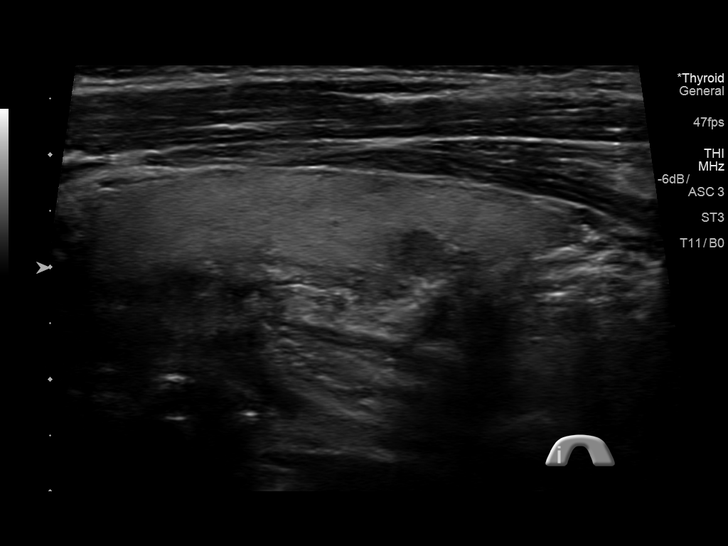
[im 20/60]
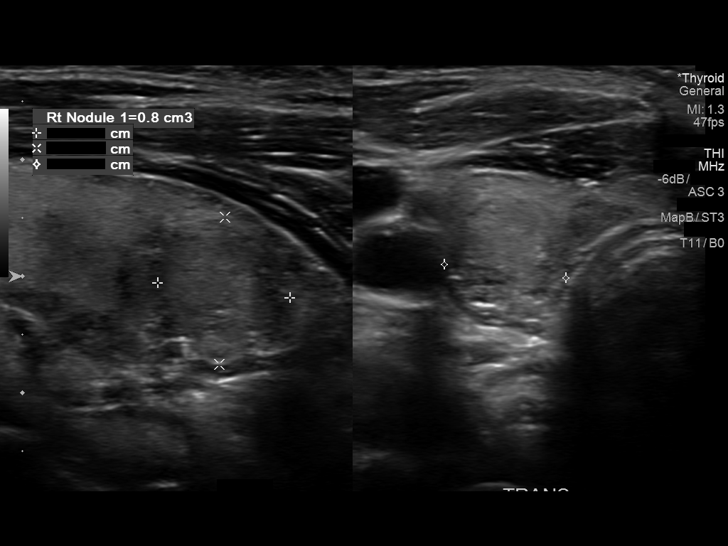
[im 25/60]
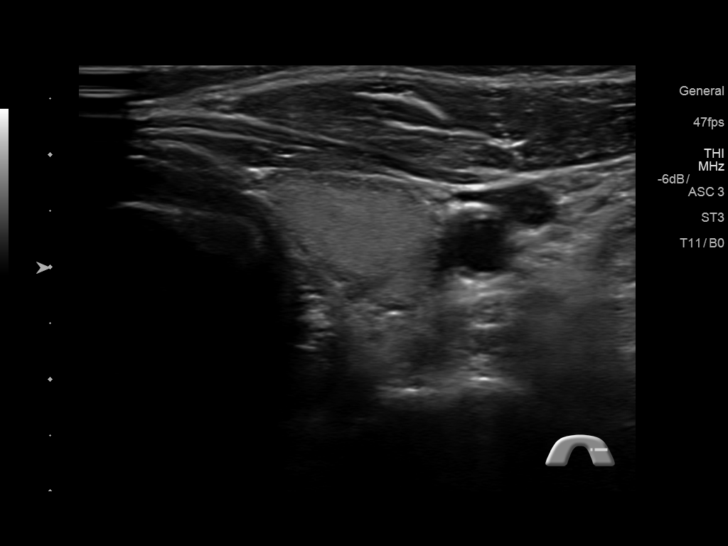
[im 30/60]
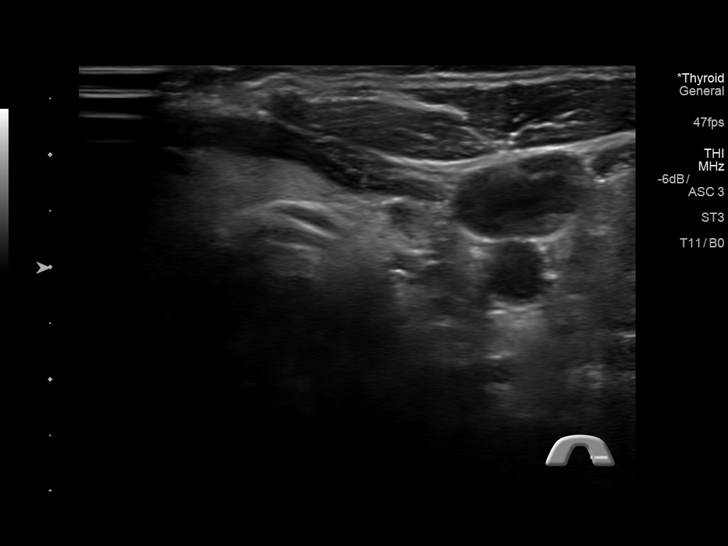
[im 35/60]
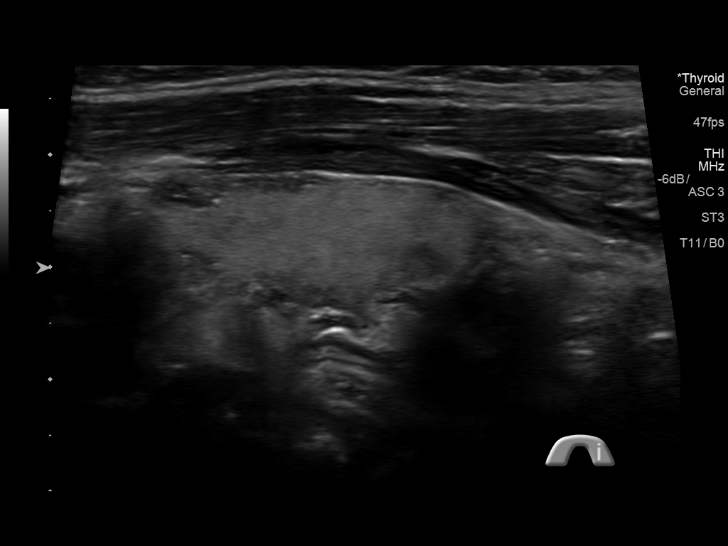
[im 40/60]
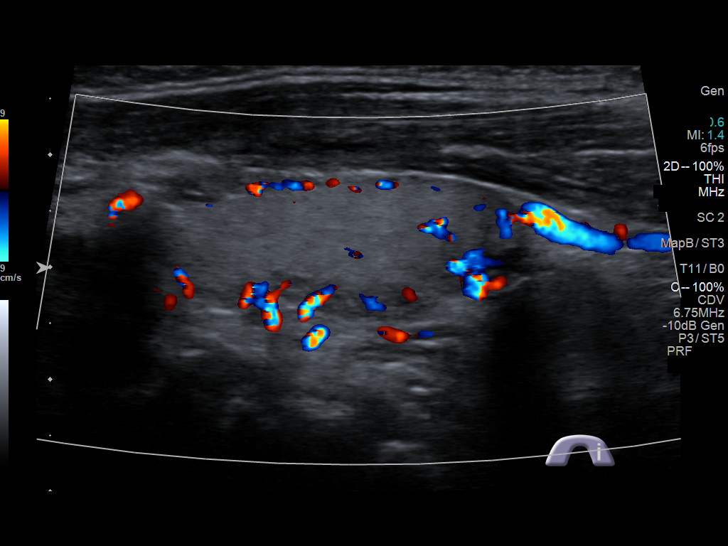
[im 45/60]
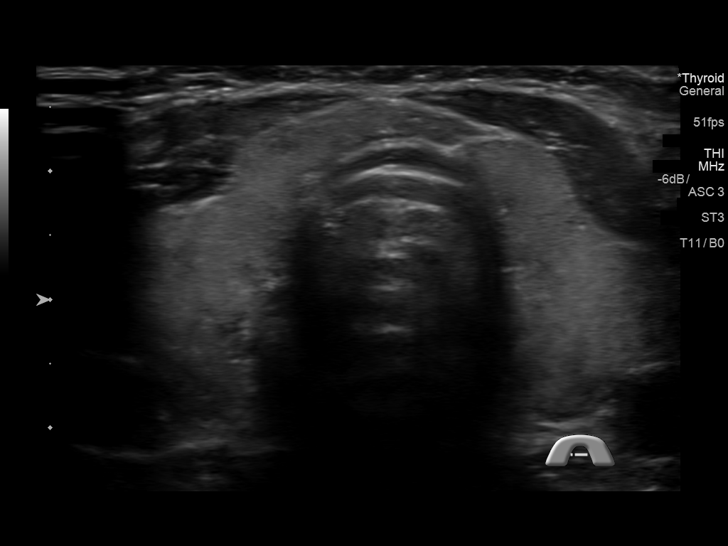
[im 50/60]
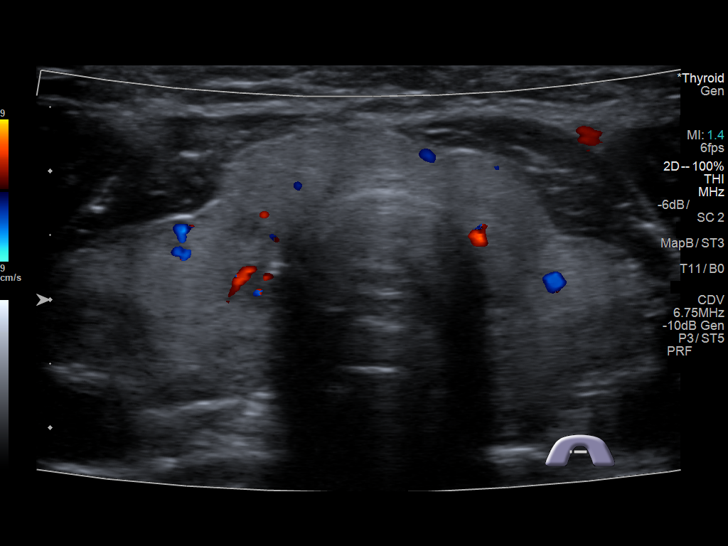
[im 55/60]
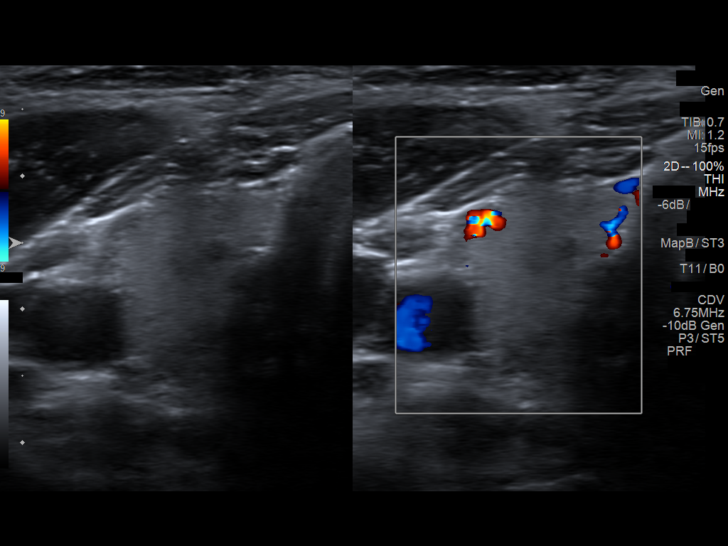
[im 60/60]
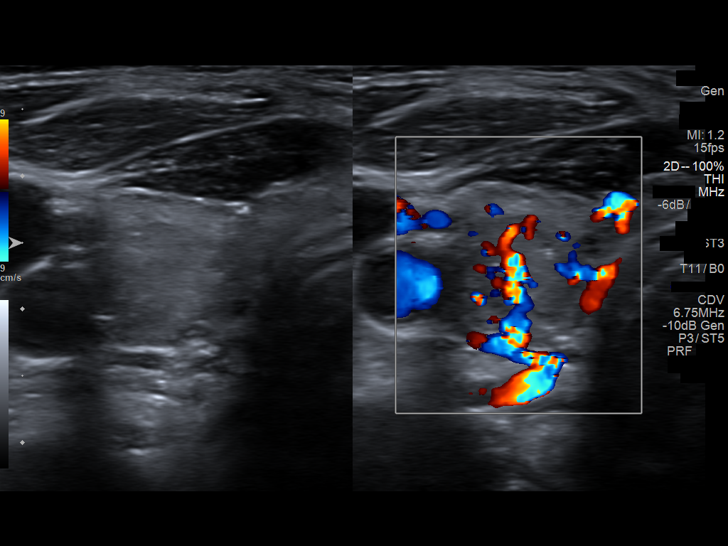

[13 of 25 positions shown; findings below may reference images not displayed]

FINDINGS: Right thyroid lobe

Measurements: 4.8 cm x 1.9 cm x 1.9 cm. Heterogeneous appearance of
the right thyroid. Inferior right thyroid nodule again measured:

Right inferior nodule #1 measures 1.1 cm x 1.3 cm x 1.1 cm Nearly
completely solid composition (2), with isoechoic echogenicity (1),
wider than tall shape (0), lobulated margin (2), with punctate
internal echogenicity (3). Total ACR points 8 for TI-RADS level of
TR 5.

Left thyroid lobe

Measurements: 4.2 cm x 1.6 cm x 1.8 cm. Heterogeneous appearance of
left-sided thyroid tissue.

Left lower nodule #1 measures 1.3 cm x 0.9 cm x 0.7 cm Nearly
completely solid composition (2), with isoechoic echogenicity (1),
wider than tall shape (0), smooth margin (0), with no internal
calcifications (0). Total ACR points 3 for TI-RADS level of TR 3.

Similar appearance of hypoechoic nodule at the posterior aspect of
the left thyroid, unchanged in characteristics when compared to the
prior, though with slight interval growth. Previous measurement
cm x 0.6 cm x 0.7 cm. Today measurement 1.5 cm x 0.7 cm x 1.0 cm

Isthmus

Thickness: 0.5 cm.  No nodules visualized.

Lymphadenopathy

None visualized.
IMPRESSION: Re- demonstration of multinodular thyroid.

Right inferior thyroid nodule meets criteria for biopsy, as
designated by the newly established ACR TI-RADS criteria, however,
biopsy has been previously performed 11/01/2012. Recommend
correlation with prior biopsy result, and surveillance may be
considered.

Left thyroid does not meet criteria for biopsy or surveillance, as
designated by the newly established ACR TI-RADS criteria.

Hypoechoic nodule posterior to the left thyroid measure slightly
greater than the comparison. Again this may represent exit phasic
nodule or parathyroid tissue, with parathyroid tissue or a lymph
node favored given the appearance. If imaging evaluation is
warranted, consider sestamibi nuclear medicine study.

Recommendations follow those established by the new ACR TI-RADS
criteria ([HOSPITAL] 2328;[DATE]).

## 2018-06-30 ENCOUNTER — Other Ambulatory Visit: Payer: Self-pay | Admitting: "Endocrinology

## 2018-06-30 ENCOUNTER — Other Ambulatory Visit: Payer: Self-pay

## 2018-06-30 DIAGNOSIS — E041 Nontoxic single thyroid nodule: Secondary | ICD-10-CM

## 2018-07-01 LAB — T4, FREE: Free T4: 1.24 ng/dL (ref 0.82–1.77)

## 2018-07-01 LAB — TSH: TSH: 4.12 u[IU]/mL (ref 0.450–4.500)

## 2018-07-07 ENCOUNTER — Ambulatory Visit: Payer: BC Managed Care – PPO | Admitting: "Endocrinology

## 2018-07-27 ENCOUNTER — Encounter: Payer: Self-pay | Admitting: "Endocrinology

## 2018-07-27 ENCOUNTER — Ambulatory Visit (INDEPENDENT_AMBULATORY_CARE_PROVIDER_SITE_OTHER): Payer: BC Managed Care – PPO | Admitting: "Endocrinology

## 2018-07-27 VITALS — BP 115/78 | HR 85 | Ht 62.0 in | Wt 166.0 lb

## 2018-07-27 DIAGNOSIS — E042 Nontoxic multinodular goiter: Secondary | ICD-10-CM

## 2018-07-27 NOTE — Progress Notes (Signed)
Endocrinology follow-up note   Subjective:    Patient ID: Michelle Maxwell, female    DOB: November 04, 1971, PCP Harlan Stains, MD   Past Medical History:  Diagnosis Date  . Bilateral groin pain 06/30/2016  . Bloating 06/19/2013  . Constipation 06/19/2013  . Fibroid 07/06/2016  . GERD (gastroesophageal reflux disease)   . Hematuria 10/15/2015  . Hot flashes 12/25/2014  . Missed periods 12/25/2014  . Perimenopause 12/25/2014  . Thyroid nodule 06/19/2013  . Trichomonal vaginitis 10/15/2015  . Vaginal discharge 10/15/2015  . Vaginal itching 10/15/2015  . Vaginal odor 10/15/2015   Past Surgical History:  Procedure Laterality Date  . CESAREAN SECTION    . COLONOSCOPY N/A 06/30/2017   Procedure: COLONOSCOPY;  Surgeon: Danie Binder, MD;  Location: [facility];  Service: Endoscopy;  Laterality: N/A;  1:00PM  . ESOPHAGOGASTRODUODENOSCOPY  02/2009   Rehman: small sliding hiatal hernia  . WISDOM TOOTH EXTRACTION     Social History   Socioeconomic History  . Marital status: Married    Spouse name: Not on file  . Number of children: Not on file  . Years of education: Not on file  . Highest education level: Not on file  Occupational History  . Not on file  Social Needs  . Financial resource strain: Not on file  . Food insecurity:    Worry: Not on file    Inability: Not on file  . Transportation needs:    Medical: Not on file    Non-medical: Not on file  Tobacco Use  . Smoking status: Never Smoker  . Smokeless tobacco: Never Used  Substance and Sexual Activity  . Alcohol use: No  . Drug use: No  . Sexual activity: Yes    Birth control/protection: Other-see comments    Comment: vasectomy  Lifestyle  . Physical activity:    Days per week: Not on file    Minutes per session: Not on file  . Stress: Not on file  Relationships  . Social connections:    Talks on phone: Not on file    Gets together: Not on file    Attends religious service: Not on file    Active member of club  or organization: Not on file    Attends meetings of clubs or organizations: Not on file    Relationship status: Not on file  Other Topics Concern  . Not on file  Social History Narrative  . Not on file   Outpatient Encounter Medications as of 07/27/2018  Medication Sig  . cholecalciferol (VITAMIN D) 1000 UNITS tablet Take 2,000 Units by mouth daily.  Marland Kitchen esomeprazole (NEXIUM) 20 MG packet Take 20 mg by mouth daily before breakfast.  . naproxen sodium (ANAPROX) 220 MG tablet Take 220 mg by mouth 2 (two) times daily with a meal.  . omeprazole (PRILOSEC) 20 MG capsule Take 20 mg by mouth daily as needed.   . [DISCONTINUED] polyethylene glycol powder (MIRALAX) powder Take 17 g by mouth daily.    No facility-administered encounter medications on file as of 07/27/2018.    ALLERGIES: No Known Allergies  VACCINATION STATUS:  There is no immunization history on file for this patient.  HPI 47 year old female patient with medical history as above. She is being seen in follow-up for history of multinodular goiter with new set of thyroid function tests. - She was known to have multinodular goiter since at least from 2013, when ultrasound of her thyroid showed 1 cm right lobe nodule with microcalcifications. She  underwent fine-needle aspiration and December 2013 which showed non-neoplastic goiter. -She did not require any thyroid intervention over the years. Her most recent thyroid ultrasound from August 2017 showed right lobe 4.8 cm with 1.3 cm nodule (previously biopsied when he was 1.0 cm), left lobe with 4.2 cm longest dimension with 2 nodules measuring 1.5 cm and 1.3 cm- these left lobe nodules were first noticed in 2013 when they were less than 1.0 cm. - She denies dysphagia, shortness of breath, nor voice change. She denies any exposure to neck radiation. She denies any family history of thyroid cancer.  -She missed her appointment for surveillance ultrasound before this visit.  She is not on  any antithyroid medications nor any thyroid hormone supplements. -She has no new complaints today.  Review of Systems  Constitutional: + Steady weight since last visit,  no fatigue, no subjective hyperthermia, no subjective hypothermia Eyes: no blurry vision, no xerophthalmia ENT: no sore throat, no nodules palpated in throat, no dysphagia/odynophagia, no hoarseness Cardiovascular: no Chest Pain, no Shortness of Breath, no palpitations, no leg swelling Respiratory: no cough, no SOB Gastrointestinal: no Nausea/Vomiting/Diarhhea Musculoskeletal: no muscle/joint aches Skin: no rashes Neurological: no tremors, no numbness, no tingling, no dizziness Psychiatric: no depression, no anxiety  Objective:    BP 115/78   Pulse 85   Ht 5\' 2"  (1.575 m)   Wt 166 lb (75.3 kg)   BMI 30.36 kg/m   Wt Readings from Last 3 Encounters:  07/27/18 166 lb (75.3 kg)  06/30/17 168 lb (76.2 kg)  06/28/17 168 lb 9.6 oz (76.5 kg)    Physical Exam  Constitutional: obese for height, not in acute distress, normal state of mind.  Eyes: PERRLA, EOMI, no exophthalmos ENT: moist mucous membranes, palpable thyromegaly, no cervical lymphadenopathy Cardiovascular: normal precordial activity, Regular Rate and Rhythm, no Murmur/Rubs/Gallops Respiratory:  adequate breathing efforts, no gross chest deformity, Clear to auscultation bilaterally Gastrointestinal: abdomen soft, Non -tender, No distension, Bowel Sounds present Musculoskeletal: no gross deformities, strength intact in all four extremities Skin: moist, warm, no rashes Neurological: no tremor with outstretched hands, Deep tendon reflexes normal in all four extremities.  CMP ( most recent) CMP     Component Value Date/Time   NA 137 06/19/2013 1445   K 5.1 06/19/2013 1445   CL 104 06/19/2013 1445   CO2 30 06/19/2013 1445   GLUCOSE 72 06/19/2013 1445   BUN 7 06/19/2013 1445   CREATININE 0.80 06/19/2013 1445   CALCIUM 9.7 06/19/2013 1445   PROT 7.3  06/19/2013 1445   ALBUMIN 4.1 06/19/2013 1445   AST 16 06/19/2013 1445   ALT 17 06/19/2013 1445   ALKPHOS 76 06/19/2013 1445   BILITOT 0.4 06/19/2013 1445      Lipid Panel ( most recent) Lipid Panel     Component Value Date/Time   CHOL 138 06/19/2013 1445   TRIG 38 06/19/2013 1445   HDL 52 06/19/2013 1445   CHOLHDL 2.7 06/19/2013 1445   VLDL 8 06/19/2013 1445   LDLCALC 78 06/19/2013 1445    Results for LARITA, DEREMER (MRN [id number]) as of 07/27/2018 16:48  Ref. Range 06/30/2018 11:49 06/30/2018 11:50  TSH Latest Ref Range: 0.450 - 4.500 uIU/mL  4.120  T4,Free(Direct) Latest Ref Range: 0.82 - 1.77 ng/dL 1.24       Assessment & Plan:   1. Multinodular goiter 2. Euthyroid  -Had repeat previsit thyroid function tests are still within normal limits, she would not require thyroid function intervention at this time.    -  she has long-standing history of euthyroid multinodular goiter with stable or slowly growing bilateral thyroid nodules evident on a series of thyroid sonograms done between 2013 and 2017. - Fine-needle aspiration cytology report of the  1.3 cm right lobe nodule from 2013 was significant for nonneoplastic goiter. - This is the nodule described on most recent ultrasound from August 2017.  - Accordingly, she was  not considered for fine needle aspiration during her last visit.   -She will obtain her surveillance thyroid ultrasound next week which will be reviewed for further decision. Suspicion for thyroid malignancy is low at this time.  - I advised patient to maintain close follow up with Harlan Stains, MD for primary care needs.  Follow up plan: Return in about 1 year (around 07/28/2019), or Thyroid Ultrasong anytime soon, for Thyroid / Neck Ultrasound, Follow up with Pre-visit Labs.  Glade Lloyd, MD Phone: [phone]  Fax: [phone]  This note was partially dictated with voice recognition software. Similar sounding words can be transcribed inadequately or  may not  be corrected upon review.  07/27/2018, 4:44 PM

## 2019-01-10 ENCOUNTER — Other Ambulatory Visit: Payer: Self-pay | Admitting: "Endocrinology

## 2019-01-10 DIAGNOSIS — E042 Nontoxic multinodular goiter: Secondary | ICD-10-CM

## 2019-02-08 ENCOUNTER — Ambulatory Visit (INDEPENDENT_AMBULATORY_CARE_PROVIDER_SITE_OTHER): Payer: BC Managed Care – PPO | Admitting: Adult Health

## 2019-02-08 ENCOUNTER — Other Ambulatory Visit: Payer: Self-pay

## 2019-02-08 ENCOUNTER — Encounter: Payer: Self-pay | Admitting: Adult Health

## 2019-02-08 ENCOUNTER — Other Ambulatory Visit (HOSPITAL_COMMUNITY)
Admission: RE | Admit: 2019-02-08 | Discharge: 2019-02-08 | Disposition: A | Discharge status: Home / Self Care | Payer: BC Managed Care – PPO | Source: Ambulatory Visit | Attending: Adult Health | Admitting: Adult Health

## 2019-02-08 VITALS — BP 116/69 | HR 78 | Ht 61.0 in | Wt 165.0 lb

## 2019-02-08 DIAGNOSIS — N951 Menopausal and female climacteric states: Secondary | ICD-10-CM

## 2019-02-08 DIAGNOSIS — Z1212 Encounter for screening for malignant neoplasm of rectum: Secondary | ICD-10-CM

## 2019-02-08 DIAGNOSIS — Z01419 Encounter for gynecological examination (general) (routine) without abnormal findings: Secondary | ICD-10-CM

## 2019-02-08 DIAGNOSIS — Z1211 Encounter for screening for malignant neoplasm of colon: Secondary | ICD-10-CM | POA: Diagnosis not present

## 2019-02-08 LAB — HEMOCCULT GUIAC POC 1CARD (OFFICE): FECAL OCCULT BLD: NEGATIVE

## 2019-02-08 NOTE — Addendum Note (Signed)
Addended by: Diona Fanti A on: 02/08/2019 04:37 PM   Modules accepted: Orders

## 2019-02-08 NOTE — Addendum Note (Signed)
Addended by: Derrek Monaco A on: 02/08/2019 04:31 PM   Modules accepted: Orders

## 2019-02-08 NOTE — Patient Instructions (Signed)

## 2019-02-08 NOTE — Progress Notes (Addendum)
Patient ID: Michelle Maxwell, female   DOB: 05-25-1971, 48 y.o.   MRN: [id number] History of Present Illness: Michelle Maxwell is a 48 year old black female, married in for a well woman gyn exam and pap.  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  PCP is Dr Dema Severin in [place].    Current Medications, Allergies, Past Medical History, Past Surgical History, Family History and Social History were reviewed in [employer] record.     Review of Systems:  Patient denies any headaches, hearing loss, fatigue, blurred vision, shortness of breath, chest pain, abdominal pain, problems with bowel movements, urination, or intercourse. No joint pain or mood swings. Has not had period since July last year, has hot flashes    Physical Exam:BP 116/69 (BP Location: Left Arm, Patient Position: Sitting, Cuff Size: Normal)   Pulse 78   Ht 5\' 1"  (1.549 m)   Wt 165 lb (74.8 kg)   BMI 31.18 kg/m LMP 05/31/18 General:  Well developed, well nourished, no acute distress Skin:  Warm and dry Neck:  Midline trachea, enlarged thyroid(to get F/U US per Dr Dorris Fetch), good ROM, no lymphadenopathy Lungs; Clear to auscultation bilaterally Breast:  No dominant palpable mass, retraction, or nipple discharge Cardiovascular: Regular rate and rhythm Abdomen:  Soft, non tender, no hepatosplenomegaly Pelvic:  External genitalia is normal in appearance, no lesions.  The vagina is normal in appearance. Urethra has no lesions or masses. The cervix is bulbous. Pap with HPV performed. Uterus is felt to be normal size, shape, and contour.  No adnexal masses or tenderness noted.Bladder is non tender, no masses felt. Rectal: Good sphincter tone, no polyps, or hemorrhoids felt.  Hemoccult negative. Extremities/musculoskeletal:  No swelling or varicosities noted, no clubbing or cyanosis Psych:  No mood changes, alert and cooperative,seems happy Fall risk is low. PHQ 9 score is 1. Examination chaperoned by Diona Fanti CMA.   Impression: 1.  Encounter for gynecological examination with Papanicolaou smear of cervix   2. Screening for colorectal cancer   3. Perimenopause       Plan: Physical in 1 year Pap in 3 if normal Labs with PCP Mammogram yearly Colonoscopy per GI Review handout on perimenopause

## 2019-02-14 LAB — CYTOLOGY - PAP
ADEQUACY: ABSENT
DIAGNOSIS: NEGATIVE
HPV: NOT DETECTED

## 2019-05-25 ENCOUNTER — Other Ambulatory Visit: Payer: Self-pay

## 2019-05-25 ENCOUNTER — Encounter: Payer: Self-pay | Admitting: Gastroenterology

## 2019-05-25 ENCOUNTER — Ambulatory Visit: Payer: BC Managed Care – PPO | Admitting: Gastroenterology

## 2019-05-25 ENCOUNTER — Encounter

## 2019-05-25 DIAGNOSIS — K5901 Slow transit constipation: Secondary | ICD-10-CM | POA: Diagnosis not present

## 2019-05-25 DIAGNOSIS — K219 Gastro-esophageal reflux disease without esophagitis: Secondary | ICD-10-CM | POA: Diagnosis not present

## 2019-05-25 NOTE — Patient Instructions (Signed)
DRINK WATER TO KEEP YOUR URINE LIGHT YELLOW.  FOLLOW A HIGH FIBER DIET. AVOID ITEMS THAT CAUSE BLOATING & GAS. SEE INFO BELOW.  TAKE MIRALAX TWICE DAILY.  USE TRULANCE OR CALL FOR AN ALTERNATIVE TO MANAGE CONSTIPATION. TAKE MIRALAX ONCE ON THE DAY YOU TAKE THE PILL.  FOLLOW UP IN OCT 2020. IF YOU FEEL WELL I CAN SEE YOU IN DEC 2020.  High-Fiber Diet A high-fiber diet changes your normal diet to include more whole grains, legumes, fruits, and vegetables. Changes in the diet involve replacing refined carbohydrates with unrefined foods. The calorie level of the diet is essentially unchanged. The Dietary Reference Intake (recommended amount) for adult males is 38 grams per day. For adult females, it is 25 grams per day. Pregnant and lactating women should consume 28 grams of fiber per day. Fiber is the intact part of a plant that is not broken down during digestion. Functional fiber is fiber that has been isolated from the plant to provide a beneficial effect in the body.  PURPOSE  Increase stool bulk.   Ease and regulate bowel movements.   Lower cholesterol.   REDUCE RISK OF COLON CANCER  INDICATIONS THAT YOU NEED MORE FIBER  Constipation and hemorrhoids.   Uncomplicated diverticulosis (intestine condition) and irritable bowel syndrome.   Weight management.   As a protective measure against hardening of the arteries (atherosclerosis), diabetes, and cancer.   GUIDELINES FOR INCREASING FIBER IN THE DIET  Start adding fiber to the diet slowly. A gradual increase of about 5 more grams (2 slices of whole-wheat bread, 2 servings of most fruits or vegetables, or 1 bowl of high-fiber cereal) per day is best. Too rapid an increase in fiber may result in constipation, flatulence, and bloating.   Drink enough water and fluids to keep your urine clear or pale yellow. Water, juice, or caffeine-free drinks are recommended. Not drinking enough fluid may cause constipation.   Eat a variety of  high-fiber foods rather than one type of fiber.   Try to increase your intake of fiber through using high-fiber foods rather than fiber pills or supplements that contain small amounts of fiber.   The goal is to change the types of food eaten. Do not supplement your present diet with high-fiber foods, but replace foods in your present diet.    INCLUDE A VARIETY OF FIBER SOURCES  Replace refined and processed grains with whole grains, canned fruits with fresh fruits, and incorporate other fiber sources. White rice, white breads, and most bakery goods contain little or no fiber.   Brown whole-grain rice, buckwheat oats, and many fruits and vegetables are all good sources of fiber. These include: broccoli, Brussels sprouts, cabbage, cauliflower, beets, sweet potatoes, white potatoes (skin on), carrots, tomatoes, eggplant, squash, berries, fresh fruits, and dried fruits.   Cereals appear to be the richest source of fiber. Cereal fiber is found in whole grains and bran. Bran is the fiber-rich outer coat of cereal grain, which is largely removed in refining. In whole-grain cereals, the bran remains. In breakfast cereals, the largest amount of fiber is found in those with "bran" in their names. The fiber content is sometimes indicated on the label.   You may need to include additional fruits and vegetables each day.   In baking, for 1 cup white flour, you may use the following substitutions:   1 cup whole-wheat flour minus 2 tablespoons.   1/2 cup white flour plus 1/2 cup whole-wheat flour.

## 2019-05-25 NOTE — Assessment & Plan Note (Signed)
SYMPTOMS FAIRLY WELL CONTROLLED ON NEXIUM AND CARAFATE.  CONTINUE TO MONITOR SYMPTOMS. FOLLOW UP IN 4-6 MOS.

## 2019-05-25 NOTE — Progress Notes (Signed)
ON RECALL  °

## 2019-05-25 NOTE — Progress Notes (Signed)
Subjective:    Patient ID: Michelle Maxwell, female    DOB: 01-24-1971, 48 y.o.   MRN: [id number]  Harlan Stains, MD   HPI Was having trouble with abdominal pain and last seen 2018 and using Miralax most days once A DAY. NOW TAKING TRULANCE AND HAVING A #1 BM(NOT EMPTY). TRULANCE HAVE HER DIARRHEA.  AND LAST MON WAS ABLE TO GET A LARGE BMA ND FEELS BETTER. FEELS CARAFATE HELPS BUT TRULANCE. BEEN TAKING OFF AND ON FOR PAST 2 YRS. AT NIGHT A LITTLE NAUSEA ASSOCIATED WITH PAIN AND CONSTIPATION. RARELY HAS A SATISFACTORY BM(#3) EXCEPT FOR MON AND WENT 4 TIMES AND STOMACH DIDN'T FEEL TIGHT. IT AS RELAXED. [place].  PT DENIES FEVER, CHILLS, HEMATOCHEZIA, HEMATEMESIS, vomiting, melena, CHEST PAIN, SHORTNESS OF BREATH,  CHANGE IN BOWEL IN HABITS, problems swallowing, problems with sedation, heartburn or indigestion.  Past Medical History:  Diagnosis Date  . Bilateral groin pain 06/30/2016  . Bloating 06/19/2013  . Constipation 06/19/2013  . Fibroid 07/06/2016  . GERD (gastroesophageal reflux disease)   . Hematuria 10/15/2015  . Hot flashes 12/25/2014  . Missed periods 12/25/2014  . Perimenopause 12/25/2014  . Thyroid nodule 06/19/2013  . Trichomonal vaginitis 10/15/2015  . Vaginal discharge 10/15/2015  . Vaginal itching 10/15/2015  . Vaginal odor 10/15/2015    Past Surgical History:  Procedure Laterality Date  . CESAREAN SECTION    . COLONOSCOPY N/A 06/30/2017   Procedure: COLONOSCOPY;  Surgeon: Danie Binder, MD;  Location: [facility];  Service: Endoscopy;  Laterality: N/A;  1:00PM  . ESOPHAGOGASTRODUODENOSCOPY  02/2009   Rehman: small sliding hiatal hernia  . WISDOM TOOTH EXTRACTION      No Known Allergies  Current Outpatient Medications  Medication Sig    . acidophilus (RISAQUAD) CAPS capsule Take by mouth as needed.    Marland Kitchen VITAMIN D 1000 UNITS tablet Take 2,000 Units by mouth daily.    Marland Kitchen esomeprazole (NEXIUM) 20 MG packet Take 20 mg by mouth daily before  breakfast.    . Plecanatide (TRULANCE) 3 MG TABS Take 1 tablet by mouth daily.    Marland Kitchen CARAFATE 1 GM/10ML suspension 33ml 30 min prior to meals and before bed    . ANAPROX 220 MG tablet Take 220 mg PO BID with a meal.    . omeprazole (PRILOSEC) 20 MG capsule Take 20 mg by mouth daily as needed.     Marland Kitchen MIRALAX / GLYCOLAX 17 g packet Take 17 g by mouth daily.     Review of Systems PER HPI OTHERWISE ALL SYSTEMS ARE NEGATIVE.    Objective:   Physical Exam Vitals signs reviewed.  Constitutional:      General: She is not in acute distress.    Appearance: She is well-developed.  HENT:     Head: Normocephalic and atraumatic.     Mouth/Throat:     Pharynx: No oropharyngeal exudate.     Comments: Mask in place Eyes:     General: No scleral icterus.    Pupils: Pupils are equal, round, and reactive to light.  Neck:     Musculoskeletal: Normal range of motion and neck supple.  Cardiovascular:     Rate and Rhythm: Normal rate and regular rhythm.     Heart sounds: Normal heart sounds.  Pulmonary:     Effort: Pulmonary effort is normal. No respiratory distress.     Breath sounds: Normal breath sounds.  Abdominal:     General: Bowel sounds are normal. There is no  distension.     Palpations: Abdomen is soft.     Tenderness: There is no abdominal tenderness.  Musculoskeletal:     Right lower leg: No edema.     Left lower leg: No edema.  Lymphadenopathy:     Cervical: No cervical adenopathy.  Neurological:     Mental Status: She is alert and oriented to person, place, and time.     Comments: NO FOCAL DEFICITS  Psychiatric:        Mood and Affect: Mood normal.     Comments: FLAT AFFECT       Assessment & Plan:

## 2019-05-25 NOTE — Progress Notes (Signed)
cc'd to pcp 

## 2019-05-25 NOTE — Assessment & Plan Note (Signed)
SYMPTOMS NOT IDEALLY CONTROLLED CLINICALLY IMPROVED AFTER LARGE BM JUN 22.  DRINK WATER TO KEEP YOUR URINE LIGHT YELLOW.  FOLLOW A HIGH FIBER DIET. AVOID ITEMS THAT CAUSE BLOATING & GAS.  HANDOUT GIVEN. TAKE MIRALAX TWICE DAILY. USE TRULANCE OR CALL FOR AN ALTERNATIVE TO MANAGE CONSTIPATION. TAKE MIRALAX ONCE ON THE DAY YOU TAKE THE PILL. FOLLOW UP IN OCT 2020. IF YOU FEEL WELL I CAN SEE YOU IN DEC 2020.

## 2019-06-29 ENCOUNTER — Ambulatory Visit: Payer: BC Managed Care – PPO | Admitting: Gastroenterology

## 2019-07-26 ENCOUNTER — Other Ambulatory Visit: Payer: Self-pay

## 2019-07-26 ENCOUNTER — Ambulatory Visit (HOSPITAL_COMMUNITY)
Admission: RE | Admit: 2019-07-26 | Discharge: 2019-07-26 | Disposition: A | Discharge status: Home / Self Care | Payer: BC Managed Care – PPO | Source: Ambulatory Visit | Attending: "Endocrinology | Admitting: "Endocrinology

## 2019-07-26 ENCOUNTER — Ambulatory Visit (HOSPITAL_COMMUNITY): Payer: BC Managed Care – PPO

## 2019-07-26 DIAGNOSIS — E042 Nontoxic multinodular goiter: Secondary | ICD-10-CM | POA: Diagnosis present

## 2019-07-31 ENCOUNTER — Ambulatory Visit (INDEPENDENT_AMBULATORY_CARE_PROVIDER_SITE_OTHER): Payer: BC Managed Care – PPO | Admitting: "Endocrinology

## 2019-07-31 ENCOUNTER — Other Ambulatory Visit: Payer: Self-pay

## 2019-07-31 ENCOUNTER — Encounter: Payer: Self-pay | Admitting: "Endocrinology

## 2019-07-31 VITALS — BP 110/77 | HR 73 | Ht 61.0 in | Wt 168.0 lb

## 2019-07-31 DIAGNOSIS — E042 Nontoxic multinodular goiter: Secondary | ICD-10-CM | POA: Diagnosis not present

## 2019-07-31 NOTE — Progress Notes (Signed)
07/31/2019   Endocrinology follow-up note   Subjective:    Patient ID: Michelle Maxwell, female    DOB: Sep 29, 1971, PCP Harlan Stains, MD   Past Medical History:  Diagnosis Date  . Bilateral groin pain 06/30/2016  . Bloating 06/19/2013  . Constipation 06/19/2013  . Fibroid 07/06/2016  . GERD (gastroesophageal reflux disease)   . Hematuria 10/15/2015  . Hot flashes 12/25/2014  . Missed periods 12/25/2014  . Perimenopause 12/25/2014  . Thyroid nodule 06/19/2013  . Trichomonal vaginitis 10/15/2015  . Vaginal discharge 10/15/2015  . Vaginal itching 10/15/2015  . Vaginal odor 10/15/2015   Past Surgical History:  Procedure Laterality Date  . CESAREAN SECTION    . COLONOSCOPY N/A 06/30/2017   Procedure: COLONOSCOPY;  Surgeon: Danie Binder, MD;  Location: [facility];  Service: Endoscopy;  Laterality: N/A;  1:00PM  . ESOPHAGOGASTRODUODENOSCOPY  02/2009   Rehman: small sliding hiatal hernia  . WISDOM TOOTH EXTRACTION     Social History   Socioeconomic History  . Marital status: Married    Spouse name: Not on file  . Number of children: 1  . Years of education: Not on file  . Highest education level: Not on file  Occupational History  . Not on file  Social Needs  . Financial resource strain: Not on file  . Food insecurity    Worry: Not on file    Inability: Not on file  . Transportation needs    Medical: Not on file    Non-medical: Not on file  Tobacco Use  . Smoking status: Never Smoker  . Smokeless tobacco: Never Used  Substance and Sexual Activity  . Alcohol use: No  . Drug use: No  . Sexual activity: Yes    Birth control/protection: Other-see comments    Comment: vasectomy  Lifestyle  . Physical activity    Days per week: Not on file    Minutes per session: Not on file  . Stress: Not on file  Relationships  . Social Herbalist on phone: Not on file    Gets together: Not on file    Attends religious service: Not on file    Active member of club or  organization: Not on file    Attends meetings of clubs or organizations: Not on file    Relationship status: Not on file  Other Topics Concern  . 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 HOBBIES: WATCH TV, SURF THE WEB, SOME READING.   Outpatient Encounter Medications as of 07/31/2019  Medication Sig  . esomeprazole (NEXIUM) 40 MG capsule Take 40 mg by mouth daily at 12 noon.  Marland Kitchen acidophilus (RISAQUAD) CAPS capsule Take by mouth as needed.  . cholecalciferol (VITAMIN D) 1000 UNITS tablet Take 2,000 Units by mouth daily.  Marland Kitchen esomeprazole (NEXIUM) 20 MG packet Take 20 mg by mouth daily before breakfast.  . naproxen sodium (ANAPROX) 220 MG tablet Take 220 mg by mouth 2 (two) times daily with a meal.  . [DISCONTINUED] omeprazole (PRILOSEC) 20 MG capsule Take 20 mg by mouth daily as needed.   . [DISCONTINUED] Plecanatide (TRULANCE) 3 MG TABS Take 1 tablet by mouth daily.  . [DISCONTINUED] polyethylene glycol (MIRALAX / GLYCOLAX) 17 g packet Take 17 g by mouth daily.  . [DISCONTINUED] sucralfate (CARAFATE) 1 GM/10ML suspension 34ml 30 min prior to meals and before bed   No facility-administered encounter medications on file as of 07/31/2019.  ALLERGIES: No Known Allergies  VACCINATION STATUS:  There is no immunization history on file for this patient.  HPI 48 year old female patient with medical history as above. She is being seen in follow-up for history of multinodular goiter with a new new thyroid ultrasound.  She did not go to the lab for previsit thyroid function tests. - She was known to have multinodular goiter since at least from 2013, when ultrasound of her thyroid showed 1 cm right lobe nodule with microcalcifications. She underwent fine-needle aspiration and December 2013 which showed non-neoplastic goiter. -She did not require any thyroid intervention over the years. Her most recent thyroid ultrasound from August  2017 showed right lobe 4.8 cm with 1.3 cm nodule (previously biopsied when he was 1.0 cm), left lobe with 4.2 cm longest dimension with 2 nodules measuring 1.5 cm and 1.3 cm- these left lobe nodules were first noticed in 2013 when they were less than 1.0 cm.  Her most recent thyroid ultrasound from July 26, 2019 shows the right lobe measures 4.8 cm unchanged, with a nodule measuring 1.2 cm previously biopsied when it was 1.1 cm.  Left lobe measured 4.2 cm (previously 3.8 cm) with 1.2 cm nodule, previously 0.9 cm. - She denies dysphagia, shortness of breath, nor voice change. She denies any exposure to neck radiation. She denies any family history of thyroid cancer.  She has no new complaints today.  She has a steady weight. She is not on any antithyroid medications nor any thyroid hormone supplements.   Review of Systems  Limited as above.  Objective:    BP 110/77   Pulse 73   Ht 5\' 1"  (1.549 m)   Wt 168 lb (76.2 kg)   BMI 31.74 kg/m   Wt Readings from Last 3 Encounters:  07/31/19 168 lb (76.2 kg)  05/25/19 165 lb (74.8 kg)  02/08/19 165 lb (74.8 kg)    Physical Exam  Constitutional:  not in acute distress, normal state of mind Eyes:  EOMI, no exophthalmos ENT: moist mucous membranes, palpable thyromegaly, no cervical lymphadenopathy  Respiratory: Adequate breathing efforts Musculoskeletal: no gross deformities, strength intact in all four extremities, no gross restriction of joint movements Skin:  no rashes, no hyperemia Neurological: no tremor with outstretched hands.   CMP ( most recent) CMP     Component Value Date/Time   NA 137 06/19/2013 1445   K 5.1 06/19/2013 1445   CL 104 06/19/2013 1445   CO2 30 06/19/2013 1445   GLUCOSE 72 06/19/2013 1445   BUN 7 06/19/2013 1445   CREATININE 0.80 06/19/2013 1445   CALCIUM 9.7 06/19/2013 1445   PROT 7.3 06/19/2013 1445   ALBUMIN 4.1 06/19/2013 1445   AST 16 06/19/2013 1445   ALT 17 06/19/2013 1445   ALKPHOS 76  06/19/2013 1445   BILITOT 0.4 06/19/2013 1445      Lipid Panel     Component Value Date/Time   CHOL 138 06/19/2013 1445   TRIG 38 06/19/2013 1445   HDL 52 06/19/2013 1445   CHOLHDL 2.7 06/19/2013 1445   VLDL 8 06/19/2013 1445   LDLCALC 78 06/19/2013 1445   Thyroid ultrasound on July 26, 2019 IMPRESSION: 1. Similar findings of multinodular goiter. No definitive worrisome new or enlarging thyroid nodules. 2. The previously biopsied right-sided nodule is grossly unchanged compared to the 03/2012 examination. Correlation with previous biopsy results is recommended. Assuming a benign pathologic diagnosis, as well as stability for greater than 5 years, repeat sampling or continued dedicated follow-up  is not recommended. 3. Remaining thyroid nodules are grossly unchanged compared to the 03/2012 examination. Stability for greater than 5 years is indicative benign etiology and as such continued dedicated follow-up is not recommended.   Assessment & Plan:   1. Multinodular goiter 2. Euthyroid     -  she has long-standing history of euthyroid multinodular goiter with stable or slowly growing bilateral thyroid nodules evident on a series of thyroid sonograms done between 2013 and 2017. - Fine-needle aspiration cytology report of the  1.3 cm right lobe nodule from 2013 was significant for nonneoplastic goiter. - This is the nodule described on most recent ultrasound from August 2017.  Her recent thyroid ultrasound is reassuring with minimal changes, see above.Suspicion for thyroid malignancy is low at this time.  - Accordingly, she was  not considered for fine needle aspiration nor surgical treatment.   She will have physical exam in a year and will decide whether to do more imaging studies.  She is advised to obtain thyroid function test today and will be called if abnormal.  She will also have another thyroid function test and office visit in a year.    - I advised patient to  maintain close follow up with Harlan Stains, MD for primary care needs.   Time for this visit: 15 minutes. Michelle Maxwell  participated in the discussions, expressed understanding, and voiced agreement with the above plans.  All questions were answered to her satisfaction. she is encouraged to contact clinic should she have any questions or concerns prior to her return visit.  Follow up plan: Return in about 1 year (around 07/30/2020), or will call her with results in a week, for Follow up with Pre-visit Labs.  Glade Lloyd, MD Phone: [phone]  Fax: [phone]  This note was partially dictated with voice recognition software. Similar sounding words can be transcribed inadequately or may not  be corrected upon review.  07/31/2019, 4:31 PM

## 2019-08-04 LAB — T3, FREE: T3, Free: 3.2 pg/mL (ref 2.3–4.2)

## 2019-08-04 LAB — T4, FREE: Free T4: 1 ng/dL (ref 0.8–1.8)

## 2019-08-04 LAB — TSH: TSH: 1.64 mIU/L

## 2019-08-29 ENCOUNTER — Encounter: Payer: Self-pay | Admitting: Gastroenterology

## 2020-07-30 ENCOUNTER — Other Ambulatory Visit: Payer: Self-pay | Admitting: "Endocrinology

## 2020-07-30 ENCOUNTER — Telehealth: Payer: Self-pay | Admitting: "Endocrinology

## 2020-07-30 DIAGNOSIS — E042 Nontoxic multinodular goiter: Secondary | ICD-10-CM

## 2020-07-30 NOTE — Telephone Encounter (Signed)
Done

## 2020-07-30 NOTE — Telephone Encounter (Signed)
Can you place lab orders? This pt actually did not do any labs

## 2020-07-31 ENCOUNTER — Telehealth: Payer: BC Managed Care – PPO | Admitting: "Endocrinology

## 2020-08-07 ENCOUNTER — Ambulatory Visit (INDEPENDENT_AMBULATORY_CARE_PROVIDER_SITE_OTHER): Payer: BC Managed Care – PPO | Admitting: Adult Health

## 2020-08-07 ENCOUNTER — Encounter: Payer: Self-pay | Admitting: Adult Health

## 2020-08-07 ENCOUNTER — Other Ambulatory Visit: Payer: Self-pay

## 2020-08-07 VITALS — BP 119/77 | HR 82 | Ht 61.0 in | Wt 166.0 lb

## 2020-08-07 DIAGNOSIS — Z780 Asymptomatic menopausal state: Secondary | ICD-10-CM

## 2020-08-07 DIAGNOSIS — Z1211 Encounter for screening for malignant neoplasm of colon: Secondary | ICD-10-CM

## 2020-08-07 DIAGNOSIS — E042 Nontoxic multinodular goiter: Secondary | ICD-10-CM | POA: Diagnosis not present

## 2020-08-07 DIAGNOSIS — Z01419 Encounter for gynecological examination (general) (routine) without abnormal findings: Secondary | ICD-10-CM | POA: Diagnosis not present

## 2020-08-07 DIAGNOSIS — L720 Epidermal cyst: Secondary | ICD-10-CM | POA: Diagnosis not present

## 2020-08-07 LAB — HEMOCCULT GUIAC POC 1CARD (OFFICE): Fecal Occult Blood, POC: NEGATIVE

## 2020-08-07 NOTE — Progress Notes (Signed)
Patient ID: Michelle Maxwell, female   DOB: 09-17-1971, 49 y.o.   MRN: [id number] History of Present Illness: Michelle Maxwell is a 49 year old black female, married, PM in for a well woman gyn exam. She had normal pap with negative HPV 02/08/2019. 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    Current Medications, Allergies, Past Medical History, Past Surgical History, Family History and Social History were reviewed in [facility] record.     Review of Systems: Patient denies any headaches, hearing loss, fatigue, blurred vision, shortness of breath, chest pain, abdominal pain, problems with bowel movements, urination, or intercourse. No joint pain or mood swings. No bleeding since July 2019. Feels swollen in hand and feet at times Has nodule right neck,no pain   Physical Exam:BP 119/77 (BP Location: Left Arm, Patient Position: Sitting, Cuff Size: Normal)   Pulse 82   Ht 5\' 1"  (1.549 m)   Wt 166 lb (75.3 kg)   LMP 05/31/2018 (Approximate)   BMI 31.37 kg/m  General:  Well developed, well nourished, no acute distress Skin:  Warm and dry Neck:  Midline trachea, enlarged thyroid(known goiter), good ROM, no lymphadenopathy,has small marble sized nodule right nck behind ear, mobile and non tender, I suspect epidermal cyst  Lungs; Clear to auscultation bilaterally Breast:  No dominant palpable mass, retraction, or nipple discharge Cardiovascular: Regular rate and rhythm Abdomen:  Soft, non tender, no hepatosplenomegaly Pelvic:  External genitalia is normal in appearance, no lesions.  The vagina is normal in appearance. Urethra has no lesions or masses. The cervix is smooth.  Uterus is felt to be normal size, shape, and contour.  No adnexal masses or tenderness noted.Bladder is non tender, no masses felt. Rectal: Good sphincter tone, no polyps, or hemorrhoids felt.  Hemoccult negative. Extremities/musculoskeletal:  No swelling or varicosities  noted, no clubbing or cyanosis Psych:  No mood changes, alert and cooperative,seems happy AA is 1 Fall risk is low PHQ 9 score is 0  Upstream - 08/07/20 1546      Pregnancy Intention Screening   Does the patient want to become pregnant in the next year? No    Does the patient's partner want to become pregnant in the next year? No    Would the patient like to discuss contraceptive options today? No      Contraception Wrap Up   Current Method Vasectomy    End Method Vasectomy    Contraception Counseling Provided No         Examination chaperoned by Rolena Infante LPN   Impression and Plan: 1. Encounter for well woman exam with routine gynecological exam Physical in 1 year Pap in 2023 Mammogram yearly  Colonoscopy per GI   2. Encounter for screening fecal occult blood testing  3. Epidermal cyst of neck Get Dr Dorris Fetch to look at, if wants excision can refer to surgeon   4. Multinodular goiter Follow up with Dr Dorris Fetch  5. Postmenopausal

## 2022-12-08 ENCOUNTER — Ambulatory Visit: Payer: BC Managed Care – PPO | Admitting: Adult Health

## 2022-12-14 ENCOUNTER — Ambulatory Visit: Payer: BC Managed Care – PPO | Admitting: Adult Health

## 2023-01-07 ENCOUNTER — Encounter: Payer: Self-pay | Admitting: Adult Health

## 2023-01-07 ENCOUNTER — Other Ambulatory Visit (HOSPITAL_COMMUNITY)
Admission: RE | Admit: 2023-01-07 | Discharge: 2023-01-07 | Disposition: A | Discharge status: Home / Self Care | Payer: BC Managed Care – PPO | Source: Ambulatory Visit | Attending: Adult Health | Admitting: Adult Health

## 2023-01-07 ENCOUNTER — Ambulatory Visit (INDEPENDENT_AMBULATORY_CARE_PROVIDER_SITE_OTHER): Payer: BC Managed Care – PPO | Admitting: Adult Health

## 2023-01-07 VITALS — BP 106/70 | HR 86 | Ht 61.0 in | Wt 181.0 lb

## 2023-01-07 DIAGNOSIS — Z01419 Encounter for gynecological examination (general) (routine) without abnormal findings: Secondary | ICD-10-CM | POA: Insufficient documentation

## 2023-01-07 DIAGNOSIS — Z1211 Encounter for screening for malignant neoplasm of colon: Secondary | ICD-10-CM | POA: Diagnosis not present

## 2023-01-07 DIAGNOSIS — Z780 Asymptomatic menopausal state: Secondary | ICD-10-CM

## 2023-01-07 LAB — HEMOCCULT GUIAC POC 1CARD (OFFICE): Fecal Occult Blood, POC: NEGATIVE

## 2023-01-07 NOTE — Progress Notes (Signed)
Patient ID: Michelle Maxwell, female   DOB: 1970/12/16, 52 y.o.   MRN: [id number] History of Present Illness: Michelle Maxwell is a 52 year old black female, married, PM in for a well woman gyn exam and pap, she had physical with PCP 12/08/22.  PCP is Dr Dema Severin.   Current Medications, Allergies, Past Medical History, Past Surgical History, Family History and Social History were reviewed in [employer] [facility] record.     Review of Systems: Patient denies any headaches, hearing loss, fatigue, blurred vision, shortness of breath, chest pain, abdominal pain, problems with urination, or intercourse. No joint pain or mood swings.  Denies any vaginal bleeding. Has pressure in pelvic area that is better after BM, but may not have BM but every 7-8 days , has miralax, but not good at taking.    Physical Exam:BP 106/70 (BP Location: Left Arm, Patient Position: Sitting, Cuff Size: Large)   Pulse 86   Ht '5\' 1"'$  (1.549 m)   Wt 181 lb (82.1 kg)   LMP 05/31/2018 (Approximate)   BMI 34.20 kg/m   General:  Well developed, well nourished, no acute distress Skin:  Warm and dry Neck:  Midline trachea, normal thyroid, good ROM, no lymphadenopathy Lungs; Clear to auscultation bilaterally Breast:  No dominant palpable mass, retraction, or nipple discharge Cardiovascular: Regular rate and rhythm Abdomen:  Soft, non tender, no hepatosplenomegaly Pelvic:  External genitalia is normal in appearance, no lesions.  The vagina is normal in appearance. Urethra has no lesions or masses. The cervix is smooth, pap with HR HPV genotyping performed.  Uterus is felt to be normal size, shape, and contour.  No adnexal masses or tenderness noted.Bladder is non tender, no masses felt. Rectal: Good sphincter tone, no polyps, or hemorrhoids felt.  Hemoccult negative. Extremities/musculoskeletal:  No swelling or varicosities noted, no clubbing or cyanosis Psych:  No mood changes, alert and cooperative,seems happy AA is 0 Fall  risk is low    01/07/2023    3:51 PM 08/07/2020    3:46 PM 02/08/2019    3:56 PM  Depression screen PHQ 2/9  Decreased Interest 1 0 0  Down, Depressed, Hopeless 0 0 0  PHQ - 2 Score 1 0 0  Altered sleeping 0 0 0  Tired, decreased energy 1 0 1  Change in appetite 0 0 0  Feeling bad or failure about yourself  0 0 0  Trouble concentrating 0 0 0  Moving slowly or fidgety/restless 0 0 0  Suicidal thoughts 0 0 0  PHQ-9 Score 2 0 1  Difficult doing work/chores  Not difficult at all Not difficult at all       01/07/2023    3:51 PM 08/07/2020    3:47 PM  GAD 7 : Generalized Anxiety Score  Nervous, Anxious, on Edge 0 0  Control/stop worrying 0 0  Worry too much - different things 0 0  Trouble relaxing 0 0  Restless 0 0  Easily annoyed or irritable 0 0  Afraid - awful might happen 0 0  Total GAD 7 Score 0 0  Anxiety Difficulty  Not difficult at all    Upstream - 01/07/23 1551       Pregnancy Intention Screening   Does the patient want to become pregnant in the next year? No    Does the patient's partner want to become pregnant in the next year? No    Would the patient like to discuss contraceptive options today? No      Contraception  Wrap Up   Current Method Vasectomy    Reason for No Current Contraceptive Method at Intake ([facility] Only) Other   postmenopause   End Method Vasectomy   PM   Contraception Counseling Provided No            Examination chaperoned by Tish RN     Impression and Plan: 1. Encounter for gynecological examination with Papanicolaou smear of cervix Pap sent Pap in 3 years if normal Physical with PCP Colonoscopy per GI Get mammogram  Labs with PCP Try senna or senna S to help with BM  2. Encounter for screening fecal occult blood testing Hemoccult negative   3. Postmenopausal Denis any bleeding

## 2023-01-14 LAB — CYTOLOGY - PAP
Adequacy: ABSENT
Comment: NEGATIVE
Diagnosis: NEGATIVE
High risk HPV: NEGATIVE

## 2023-10-06 ENCOUNTER — Ambulatory Visit: Payer: BC Managed Care – PPO | Admitting: "Endocrinology

## 2023-10-06 ENCOUNTER — Encounter: Payer: Self-pay | Admitting: "Endocrinology

## 2023-10-06 VITALS — BP 104/68 | HR 84 | Ht 61.0 in | Wt 180.0 lb

## 2023-10-06 DIAGNOSIS — E042 Nontoxic multinodular goiter: Secondary | ICD-10-CM | POA: Diagnosis not present

## 2023-10-06 NOTE — Progress Notes (Unsigned)
Endocrinology Consult Note                                            10/06/2023, 2:49 PM   Subjective:    Patient ID: Michelle Maxwell, female    DOB: 1971-05-21, PCP Laurann Montana, MD   Past Medical History:  Diagnosis Date  . Bilateral groin pain 06/30/2016  . Bloating 06/19/2013  . Constipation 06/19/2013  . Fibroid 07/06/2016  . GERD (gastroesophageal reflux disease)   . Hematuria 10/15/2015  . Hot flashes 12/25/2014  . Missed periods 12/25/2014  . Perimenopause 12/25/2014  . Thyroid nodule 06/19/2013  . Trichomonal vaginitis 10/15/2015  . Vaginal discharge 10/15/2015  . Vaginal itching 10/15/2015  . Vaginal odor 10/15/2015   Past Surgical History:  Procedure Laterality Date  . CESAREAN SECTION    . COLONOSCOPY N/A 06/30/2017   Procedure: COLONOSCOPY;  Surgeon: West Bali, MD;  [facility];  Service: Endoscopy;  Laterality: N/A;  1:00PM  . ESOPHAGOGASTRODUODENOSCOPY  02/2009   Rehman: small sliding hiatal hernia  . WISDOM TOOTH EXTRACTION     Social History   Socioeconomic History  . Marital status: Married    Spouse name: Not on file  . Number of children: 1  . Years of education: Not on file  . Highest education level: Not on file  Occupational History  . Not on file  Tobacco Use  . Smoking status: Never  . Smokeless tobacco: Never  Vaping Use  . Vaping status: Never Used  Substance and Sexual Activity  . Alcohol use: Not Currently    Comment: occassionally  . Drug use: No  . Sexual activity: Yes    Birth control/protection: Other-see comments, Post-menopausal    Comment: vasectomy  Other Topics Concern  . 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 HOBBIES: WATCH TV, SURF THE WEB, SOME READING.   Social Determinants of Health   Financial Resource Strain: Low Risk  (01/07/2023)   Overall Financial Resource Strain (CARDIA)   . Difficulty of Paying Living Expenses: Not  hard at all  Food Insecurity: No Food Insecurity (01/07/2023)   Hunger Vital Sign   . Worried About Programme researcher, broadcasting/film/video in the Last Year: Never true   . Ran Out of Food in the Last Year: Never true  Transportation Needs: No Transportation Needs (01/07/2023)   PRAPARE - Transportation   . Lack of Transportation (Medical): No   . Lack of Transportation (Non-Medical): No  Physical Activity: Inactive (01/07/2023)   Exercise Vital Sign   . Days of Exercise per Week: 0 days   . Minutes of Exercise per Session: 0 min  Stress: No Stress Concern Present (01/07/2023)   [employer] of Occupational Health - Occupational Stress Questionnaire   . Feeling of Stress : Only a little  Social Connections: Socially Integrated (01/07/2023)   Social Connection and Isolation Panel [NHANES]   . Frequency of Communication with Friends and Family: More than three times a week   . Frequency of Social Gatherings with Friends and Family: Once a week   . Attends Religious Services: More than 4 times per year   . Active Member of Clubs or Organizations: Yes   . Attends Banker Meetings: More than 4 times per year   . Marital  Status: Married   Family History  Problem Relation Age of Onset  . Cancer Mother 105       breast  . Cancer Father        brain  . Colon cancer Neg Hx    Outpatient Encounter Medications as of 10/06/2023  Medication Sig  . acidophilus (RISAQUAD) CAPS capsule Take by mouth as needed. (Patient not taking: Reported on 01/07/2023)  . cholecalciferol (VITAMIN D) 1000 UNITS tablet Take 2,000 Units by mouth daily.  Marland Kitchen esomeprazole (NEXIUM) 20 MG packet Take 40 mg by mouth daily before breakfast.  . naproxen sodium (ALEVE) 220 MG tablet 1 tablet as needed Orally every 12 hrs  . Polyethylene Glycol 3350 (MIRALAX PO) 17 grams orally twice a day  . [DISCONTINUED] naproxen sodium (ANAPROX) 220 MG tablet Take 220 mg by mouth 2 (two) times daily with a meal. (Patient not taking: Reported on  01/07/2023)   No facility-administered encounter medications on file as of 10/06/2023.   ALLERGIES: No Known Allergies  VACCINATION STATUS: Immunization History  Administered Date(s) Administered  . Influenza,inj,Quad PF,6+ Mos 09/08/2020    HPI Michelle Maxwell is 52 y.o. female who presents today with a medical history as above. she is being seen in consultation for *** requested by Laurann Montana, MD.  she has been dealing with symptoms of  ***, ***, ***, and *** for ***. she denies ***.  Review of Systems  Constitutional: ***mildly fluctuating body weight with recent weight ***, no fatigue, no subjective hyperthermia, no subjective hypothermia Eyes: no blurry vision, no xerophthalmia ENT: no sore throat, no nodules palpated in throat, no dysphagia/odynophagia, no hoarseness Cardiovascular: no Chest Pain, no Shortness of Breath, no palpitations, no leg swelling Respiratory: no cough, no shortness of breath Gastrointestinal: no Nausea/Vomiting/Diarhhea Musculoskeletal: no muscle/joint aches Skin: no rashes Neurological: no tremors, no numbness, no tingling, no dizziness Psychiatric: no depression, no anxiety  Objective:       10/06/2023    2:25 PM 01/07/2023    3:56 PM 08/07/2020    3:42 PM  Vitals with BMI  Height 5\' 1"  5\' 1"  5\' 1"   Weight 180 lbs 181 lbs 166 lbs  BMI 34.03 34.22 31.38  Systolic 104 106 454  Diastolic 68 70 77  Pulse 84 86 82    BP 104/68   Pulse 84   Ht 5\' 1"  (1.549 m)   Wt 180 lb (81.6 kg)   LMP 05/31/2018 (Approximate)   BMI 34.01 kg/m   Wt Readings from Last 3 Encounters:  10/06/23 180 lb (81.6 kg)  01/07/23 181 lb (82.1 kg)  08/07/20 166 lb (75.3 kg)    Physical Exam  Constitutional:  Body mass index is 34.01 kg/m.,  not in acute distress, normal state of mind Eyes: PERRLA, EOMI, no exophthalmos ENT: moist mucous membranes, no gross thyromegaly, no gross cervical lymphadenopathy Cardiovascular: normal precordial activity, Regular Rate  and Rhythm, no Murmur/Rubs/Gallops Respiratory:  adequate breathing efforts, no gross chest deformity, Clear to auscultation bilaterally Gastrointestinal: abdomen soft, Non -tender, No distension, Bowel Sounds present, no gross organomegaly Musculoskeletal: no gross deformities, strength intact in all four extremities, no peripheral edema Skin: moist, warm, no rashes Neurological: no tremor with outstretched hands, Deep tendon reflexes normal in bilateral lower extremities.  CMP ( most recent) CMP     Component Value Date/Time   NA 137 06/19/2013 1445   K 5.1 06/19/2013 1445   CL 104 06/19/2013 1445   CO2 30 06/19/2013 1445   GLUCOSE 72 06/19/2013  1445   BUN 7 06/19/2013 1445   CREATININE 0.80 06/19/2013 1445   CALCIUM 9.7 06/19/2013 1445   PROT 7.3 06/19/2013 1445   ALBUMIN 4.1 06/19/2013 1445   AST 16 06/19/2013 1445   ALT 17 06/19/2013 1445   ALKPHOS 76 06/19/2013 1445   BILITOT 0.4 06/19/2013 1445     Diabetic Labs (most recent): No results found for: "HGBA1C", "MICROALBUR"   Lipid Panel ( most recent) Lipid Panel     Component Value Date/Time   CHOL 138 06/19/2013 1445   TRIG 38 06/19/2013 1445   HDL 52 06/19/2013 1445   CHOLHDL 2.7 06/19/2013 1445   VLDL 8 06/19/2013 1445   LDLCALC 78 06/19/2013 1445      Lab Results  Component Value Date   TSH 1.64 08/04/2019   TSH 4.120 06/30/2018   TSH 2.58 06/28/2017   TSH 3.631 12/25/2014   TSH 2.597 06/19/2013   FREET4 1.0 08/04/2019   FREET4 1.24 06/30/2018   FREET4 1.1 06/28/2017           Assessment & Plan:   1. Multinodular goiter *** - TSH - T4, free - T3, free - US THYROID   - Michelle Maxwell  is being seen at a kind request of Laurann Montana, MD. - I have reviewed her available *** records and clinically evaluated the patient. - Based on these reviews, she has ***,  however,  there is not sufficient information to proceed with definitive treatment plan.  - she will need a repeat,  more  complete *** towards confirming the diagnosis.  -she will return in *** week to review her repeat labs.   If her  labs are suggestive of ***, she will be considered for *** to confirm the diagnosis.  - I did not initiate any new prescriptions today. - she is advised to maintain close follow up with Laurann Montana, MD for primary care needs.   -Thank you for involving me in the care of this pleasant patient.  Time spent with the patient: ***  minutes, of which >50% was spent in  counseling her about her *** and the rest in obtaining information about her symptoms, reviewing her previous labs/studies ( including abstractions from other facilities),  evaluations, and treatments,  and developing a plan to confirm diagnosis and long term treatment based on the latest standards of care/guidelines; and documenting her care.  Michelle Maxwell participated in the discussions, expressed understanding, and voiced agreement with the above plans.  All questions were answered to her satisfaction. she is encouraged to contact clinic should she have any questions or concerns prior to her return visit.  Follow up plan: Return in about 5 weeks (around 11/10/2023) for Labs Today- Non-Fasting Ok, Thyroid / Neck Ultrasound.   Marquis Lunch, MD [facility] [address] [place], [place] [address] Phone: [phone]  Fax: [phone]     10/06/2023, 2:49 PM  This note was partially dictated with voice recognition software. Similar sounding words can be transcribed inadequately or may not  be corrected upon review.

## 2023-10-07 LAB — T4, FREE: Free T4: 1.07 ng/dL (ref 0.82–1.77)

## 2023-10-07 LAB — T3, FREE: T3, Free: 2.9 pg/mL (ref 2.0–4.4)

## 2023-10-07 LAB — TSH: TSH: 2.53 u[IU]/mL (ref 0.450–4.500)

## 2023-11-10 ENCOUNTER — Ambulatory Visit (HOSPITAL_COMMUNITY)
Admission: RE | Admit: 2023-11-10 | Discharge: 2023-11-10 | Disposition: A | Discharge status: Home / Self Care | Payer: BC Managed Care – PPO | Source: Ambulatory Visit | Attending: "Endocrinology | Admitting: "Endocrinology

## 2023-11-10 ENCOUNTER — Ambulatory Visit: Payer: BC Managed Care – PPO | Admitting: "Endocrinology

## 2023-11-10 DIAGNOSIS — E042 Nontoxic multinodular goiter: Secondary | ICD-10-CM | POA: Diagnosis present

## 2023-11-17 ENCOUNTER — Encounter: Payer: Self-pay | Admitting: "Endocrinology

## 2023-11-17 ENCOUNTER — Ambulatory Visit: Payer: BC Managed Care – PPO | Admitting: "Endocrinology

## 2023-11-17 VITALS — BP 128/74 | HR 84 | Ht 61.0 in | Wt 176.0 lb

## 2023-11-17 DIAGNOSIS — E042 Nontoxic multinodular goiter: Secondary | ICD-10-CM | POA: Diagnosis not present

## 2023-11-17 NOTE — Progress Notes (Signed)
11/17/2023, 9:04 AM   Endocrinology follow-up note  Subjective:    Patient ID: Michelle Maxwell, female    DOB: 1971/03/20, PCP Laurann Montana, MD   Past Medical History:  Diagnosis Date   Bilateral groin pain 06/30/2016   Bloating 06/19/2013   Constipation 06/19/2013   Fibroid 07/06/2016   GERD (gastroesophageal reflux disease)    Hematuria 10/15/2015   Hot flashes 12/25/2014   Missed periods 12/25/2014   Perimenopause 12/25/2014   Thyroid nodule 06/19/2013   Trichomonal vaginitis 10/15/2015   Vaginal discharge 10/15/2015   Vaginal itching 10/15/2015   Vaginal odor 10/15/2015   Past Surgical History:  Procedure Laterality Date   CESAREAN SECTION     COLONOSCOPY N/A 06/30/2017   Procedure: COLONOSCOPY;  Surgeon: West Bali, MD;  Location: [facility];  Service: Endoscopy;  Laterality: N/A;  1:00PM   ESOPHAGOGASTRODUODENOSCOPY  02/2009   Rehman: small sliding hiatal hernia   WISDOM TOOTH EXTRACTION     Social History   Socioeconomic History   Marital status: Married    Spouse name: Not on file   Number of children: 1   Years of education: Not on file   Highest education level: Not on file  Occupational History   Not on file  Tobacco Use   Smoking status: Never   Smokeless tobacco: Never  Vaping Use   Vaping status: Never Used  Substance and Sexual Activity   Alcohol use: Not Currently    Comment: occassionally   Drug use: No   Sexual activity: Yes    Birth control/protection: Other-see comments, Post-menopausal    Comment: vasectomy  Other Topics Concern   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 HOBBIES: WATCH TV, SURF THE WEB, SOME READING.   Social Drivers of Corporate investment banker Strain: Low Risk  (01/07/2023)   Overall Financial Resource Strain (CARDIA)    Difficulty of Paying Living Expenses: Not hard at all  Food  Insecurity: No Food Insecurity (01/07/2023)   Hunger Vital Sign    Worried About Running Out of Food in the Last Year: Never true    Ran Out of Food in the Last Year: Never true  Transportation Needs: No Transportation Needs (01/07/2023)   PRAPARE - Administrator, Civil Service (Medical): No    Lack of Transportation (Non-Medical): No  Physical Activity: Inactive (01/07/2023)   Exercise Vital Sign    Days of Exercise per Week: 0 days    Minutes of Exercise per Session: 0 min  Stress: No Stress Concern Present (01/07/2023)   [employer] of Occupational Health - Occupational Stress Questionnaire    Feeling of Stress : Only a little  Social Connections: Socially Integrated (01/07/2023)   Social Connection and Isolation Panel [NHANES]    Frequency of Communication with Friends and Family: More than three times a week    Frequency of Social Gatherings with Friends and Family: Once a week    Attends Religious Services: More than 4 times per year    Active Member of [employer] or Organizations: Yes    Attends Banker Meetings: More than 4 times per year  Marital Status: Married   Family History  Problem Relation Age of Onset   Cancer Mother 57       breast   Cancer Father        brain   Colon cancer Neg Hx    Outpatient Encounter Medications as of 11/17/2023  Medication Sig   acidophilus (RISAQUAD) CAPS capsule Take by mouth as needed. (Patient not taking: Reported on 01/07/2023)   cholecalciferol (VITAMIN D) 1000 UNITS tablet Take 2,000 Units by mouth daily.   esomeprazole (NEXIUM) 20 MG packet Take 40 mg by mouth daily before breakfast.   naproxen sodium (ALEVE) 220 MG tablet 1 tablet as needed Orally every 12 hrs   Polyethylene Glycol 3350 (MIRALAX PO) 17 grams orally twice a day   No facility-administered encounter medications on file as of 11/17/2023.   ALLERGIES: No Known Allergies  VACCINATION STATUS: Immunization History  Administered Date(s)  Administered   Influenza,inj,Quad PF,6+ Mos 09/08/2020    HPI Michelle Maxwell is 52 y.o. female who presents today with a medical history as above. she is being seen in follow-up after she was seen in consultation for multinodular goiter requested by Laurann Montana, MD.    She was previously seen in this clinic between 2018 and 2024 in consultation for multinodular goiter.  She was sent for repeat thyroid ultrasound which showed similar findings of stable nodules bilaterally-see ultrasound summarized below.  - She underwent fine-needle aspiration biopsy in December 2013 which showed non-neoplastic goiter. -She did not require any thyroid intervention over the years.   Her previsit thyroid function tests are consistent with euthyroid state.  She has no new complaints today.  - She denies dysphagia, shortness of breath, nor voice change. She denies any exposure to neck radiation. She denies any family history of thyroid cancer.  She has no new complaints today.  She has a steady weight.    Review of Systems  Constitutional: +mildly fluctuating body weight  no fatigue, no subjective hyperthermia, no subjective hypothermia Eyes: no blurry vision, no xerophthalmia ENT: no sore throat, no nodules palpated in throat, no dysphagia/odynophagia, no hoarseness   Objective:       11/17/2023    8:53 AM 10/06/2023    2:25 PM 01/07/2023    3:56 PM  Vitals with BMI  Height 5\' 1"  5\' 1"  5\' 1"   Weight 176 lbs 180 lbs 181 lbs  BMI 33.27 34.03 34.22  Systolic 128 104 696  Diastolic 74 68 70  Pulse 84 84 86    BP 128/74   Pulse 84   Ht 5\' 1"  (1.549 m)   Wt 176 lb (79.8 kg)   LMP 05/31/2018 (Approximate)   BMI 33.25 kg/m   Wt Readings from Last 3 Encounters:  11/17/23 176 lb (79.8 kg)  10/06/23 180 lb (81.6 kg)  01/07/23 181 lb (82.1 kg)    Physical Exam  Constitutional:  Body mass index is 33.25 kg/m.,  not in acute distress, normal state of mind Eyes: PERRLA, EOMI, no  exophthalmos ENT: moist mucous membranes, +gross palpable thyroid,  no gross cervical lymphadenopathy   CMP ( most recent) CMP     Component Value Date/Time   NA 137 06/19/2013 1445   K 5.1 06/19/2013 1445   CL 104 06/19/2013 1445   CO2 30 06/19/2013 1445   GLUCOSE 72 06/19/2013 1445   BUN 7 06/19/2013 1445   CREATININE 0.80 06/19/2013 1445   CALCIUM 9.7 06/19/2013 1445   PROT 7.3 06/19/2013 1445   ALBUMIN  4.1 06/19/2013 1445   AST 16 06/19/2013 1445   ALT 17 06/19/2013 1445   ALKPHOS 76 06/19/2013 1445   BILITOT 0.4 06/19/2013 1445       Lipid Panel ( most recent) Lipid Panel     Component Value Date/Time   CHOL 138 06/19/2013 1445   TRIG 38 06/19/2013 1445   HDL 52 06/19/2013 1445   CHOLHDL 2.7 06/19/2013 1445   VLDL 8 06/19/2013 1445   LDLCALC 78 06/19/2013 1445      Lab Results  Component Value Date   TSH 2.530 10/06/2023   TSH 1.64 08/04/2019   TSH 4.120 06/30/2018   TSH 2.58 06/28/2017   TSH 3.631 12/25/2014   FREET4 1.07 10/06/2023   FREET4 1.0 08/04/2019   FREET4 1.24 06/30/2018   FREET4 1.1 06/28/2017      Her last thyroid ultrasound from July 26, 2019 shows the right lobe measures 4.8 cm unchanged, with a nodule measuring 1.2 cm previously biopsied when it was 1.1 cm.  Left lobe measured 4.2 cm (previously 3.8 cm) with 1.2 cm nodule, previously 0.9 cm.   Repeat thyroid ultrasound on November 10, 2023 CLINICAL DATA:  Goiter.  Prior biopsy of right-sided thyroid nodule     COMPARISON:  Multiple prior thyroid ultrasounds including 07/26/2019 and 12/26/2014   FINDINGS: Parenchymal Echotexture: Mildly heterogenous   Isthmus: 0.3 cm   Right lobe: 4.6 x 1.6 x 1.8 cm, left lobe: 3.6 x 1.3 x 1.6 cm   _________________________________________________________   Estimated total number of nodules >/= 1 cm: 2   Number of spongiform nodules >/=  2 cm not described below (TR1): 0   Number of mixed cystic and solid nodules >/= 1.5 cm not  described below (TR2): 0   _________________________________________________________   Nodule # 1: Continued stability of isoechoic solid nodule in the right lower gland at a maximum of 1.1 cm. Given size (<1.4 cm) and appearance, this nodule does NOT meet TI-RADS criteria for biopsy or dedicated follow-up.   Nodule # 2: Greater than 5 year stability of hypoechoic solid nodule in the left lower gland with a maximal diameter of 1.0 cm. Greater than 5 years of stability is consistent with benignity.   IMPRESSION: Overall stable appearance of the thyroid gland with small nodules bilaterally which demonstrate no interval change across multiple prior studies for more than 5 years consistent with benignity.   No new nodules or suspicious features.   The above is in keeping with the ACR TI-RADS recommendations - J Am Coll Radiol 2017;14:587-595.     Electronically Signed   By: Malachy Moan M.D.   On: 11/11/2023 07:41   Assessment & Plan:   1. Multinodular goiter   - I have reviewed her  new and available thyroid records and clinically evaluated the patient. - Based on these reviews, she has stable longstanding multinodular goiter with euthyroid presentation.  She did have prior negative fine-needle aspiration biopsy.  She will not need any intervention with surgery or thyroid hormone at this time.  She will need yearly thyroid function tests.  - I did not initiate any new prescriptions today. - she is advised to maintain close follow up with Laurann Montana, MD for primary care needs.  I spent  22  minutes in the care of the patient today including review of labs from Thyroid Function, CMP, and other relevant labs ; imaging/biopsy records (current and previous including abstractions from other facilities); face-to-face time discussing  her lab results and  symptoms, medications doses, her options of short and long term treatment based on the latest standards of care /  guidelines;   and documenting the encounter.  Michelle Maxwell  participated in the discussions, expressed understanding, and voiced agreement with the above plans.  All questions were answered to her satisfaction. she is encouraged to contact clinic should she have any questions or concerns prior to her return visit.   Follow up plan: Return in about 1 year (around 11/16/2024) for F/U with Pre-visit Labs.   Marquis Lunch, MD [facility] [address] [place], [place] [address] Phone: [phone]  Fax: [phone]     11/17/2023, 9:04 AM  This note was partially dictated with voice recognition software. Similar sounding words can be transcribed inadequately or may not  be corrected upon review.

## 2024-01-10 ENCOUNTER — Encounter: Payer: Self-pay | Admitting: Adult Health

## 2024-01-10 ENCOUNTER — Ambulatory Visit: Payer: 59 | Admitting: Adult Health

## 2024-01-10 VITALS — BP 111/69 | HR 80 | Ht 61.0 in | Wt 178.0 lb

## 2024-01-10 DIAGNOSIS — Z01419 Encounter for gynecological examination (general) (routine) without abnormal findings: Secondary | ICD-10-CM | POA: Diagnosis not present

## 2024-01-10 DIAGNOSIS — Z1331 Encounter for screening for depression: Secondary | ICD-10-CM

## 2024-01-10 DIAGNOSIS — E041 Nontoxic single thyroid nodule: Secondary | ICD-10-CM | POA: Diagnosis not present

## 2024-01-10 DIAGNOSIS — Z1211 Encounter for screening for malignant neoplasm of colon: Secondary | ICD-10-CM | POA: Diagnosis not present

## 2024-01-10 DIAGNOSIS — Z780 Asymptomatic menopausal state: Secondary | ICD-10-CM

## 2024-01-10 LAB — HEMOCCULT GUIAC POC 1CARD (OFFICE): Fecal Occult Blood, POC: NEGATIVE

## 2024-01-10 NOTE — Progress Notes (Signed)
 Patient ID: Michelle Maxwell, female   DOB: 08-03-1971, 53 y.o.   MRN: [id number] History of Present Illness: Oma is a 53 year old  black female,married, PM in for a well woman gyn exam.     Component Value Date/Time   DIAGPAP  01/07/2023 1558    - Negative for intraepithelial lesion or malignancy (NILM)   DIAGPAP  02/08/2019 0000    NEGATIVE FOR INTRAEPITHELIAL LESIONS OR MALIGNANCY.   HPVHIGH Negative 01/07/2023 1558   ADEQPAP  01/07/2023 1558    Satisfactory for evaluation; transformation zone component ABSENT.   ADEQPAP  02/08/2019 0000    Satisfactory for evaluation  endocervical/transformation zone component ABSENT.    PCP is Dr Camilo Cella   Current Medications, Allergies, Past Medical History, Past Surgical History, Family History and Social History were reviewed in [employer] electronic medical record.     Review of Systems: Patient denies any headaches, hearing loss, fatigue, blurred vision, shortness of breath, chest pain, abdominal pain, problems with bowel movements, urination, or intercourse. No joint pain or mood swings.  Denies any vaginal bleeding   Physical Exam:BP 111/69 (BP Location: Left Arm, Patient Position: Sitting, Cuff Size: Normal)   Pulse 80   Ht 5\' 1"  (1.549 m)   Wt 178 lb (80.7 kg)   LMP 05/31/2018 (Approximate)   BMI 33.63 kg/m   General:  Well developed, well nourished, no acute distress Skin:  Warm and dry Neck:  Midline trachea,  thyroid  nodule on the left(had US  11/10/23) , good ROM, no lymphadenopathy Lungs; Clear to auscultation bilaterally Breast:  No dominant palpable mass, retraction, or nipple discharge Cardiovascular: Regular rate and rhythm Abdomen:  Soft, non tender, no hepatosplenomegaly Pelvic:  External genitalia is normal in appearance, no lesions.  The vagina is normal in appearance. Urethra has no lesions or masses. The cervix is smmoth.  Uterus is felt to be normal size, shape, and contour.  No adnexal masses or tenderness  noted.Bladder is non tender, no masses felt. Rectal: Good sphincter tone, no polyps, or hemorrhoids felt.  Hemoccult negative. Extremities/musculoskeletal:  No swelling or varicosities noted, no clubbing or cyanosis Psych:  No mood changes, alert and cooperative,seems happy AA is 0 Fall risk is low    01/10/2024    3:48 PM 01/07/2023    3:51 PM 08/07/2020    3:46 PM  Depression screen PHQ 2/9  Decreased Interest 1 1 0  Down, Depressed, Hopeless 0 0 0  PHQ - 2 Score 1 1 0  Altered sleeping 1 0 0  Tired, decreased energy 1 1 0  Change in appetite 0 0 0  Feeling bad or failure about yourself  0 0 0  Trouble concentrating 0 0 0  Moving slowly or fidgety/restless 0 0 0  Suicidal thoughts 0 0 0  PHQ-9 Score 3 2 0  Difficult doing work/chores   Not difficult at all       01/10/2024    3:48 PM 01/07/2023    3:51 PM 08/07/2020    3:47 PM  GAD 7 : Generalized Anxiety Score  Nervous, Anxious, on Edge 1 0 0  Control/stop worrying 1 0 0  Worry too much - different things 1 0 0  Trouble relaxing 0 0 0  Restless 0 0 0  Easily annoyed or irritable 0 0 0  Afraid - awful might happen 1 0 0  Total GAD 7 Score 4 0 0  Anxiety Difficulty   Not difficult at all      Upstream -  01/10/24 1546       Pregnancy Intention Screening   Does the patient want to become pregnant in the next year? No    Does the patient's partner want to become pregnant in the next year? No    Would the patient like to discuss contraceptive options today? No      Contraception Wrap Up   Current Method Vasectomy   PM   End Method Vasectomy   PM   Contraception Counseling Provided No            Examination chaperoned by Alphonso Aschoff LPN  Impression and plan: 1. Encounter for well woman exam with routine gynecological exam (Primary) Physical in 1 year Pap in 2027 Mammogram was negative 06/15/23 Colonoscopy per GI Labs with PCP  2. Encounter for screening fecal occult blood testing Hemoccult was negative   3.  Postmenopausal Denies any vaginal bleeding   4. Thyroid  nodule Has small nodule had left had US  11/10/23

## 2024-11-03 ENCOUNTER — Telehealth: Payer: Self-pay | Admitting: "Endocrinology

## 2024-11-03 DIAGNOSIS — E042 Nontoxic multinodular goiter: Secondary | ICD-10-CM

## 2024-11-03 NOTE — Telephone Encounter (Signed)
 Pt needs labs updated

## 2024-11-03 NOTE — Telephone Encounter (Signed)
 Labs updated and sent to [facility].

## 2024-11-11 LAB — T4, FREE: Free T4: 1.06 ng/dL (ref 0.82–1.77)

## 2024-11-11 LAB — TSH: TSH: 3.23 u[IU]/mL (ref 0.450–4.500)

## 2024-11-11 LAB — T3, FREE: T3, Free: 3.2 pg/mL (ref 2.0–4.4)

## 2024-11-16 ENCOUNTER — Encounter: Payer: Self-pay | Admitting: "Endocrinology

## 2024-11-16 ENCOUNTER — Ambulatory Visit: Payer: BC Managed Care – PPO | Admitting: "Endocrinology

## 2024-11-16 VITALS — BP 110/72 | HR 82 | Ht 61.0 in | Wt 180.4 lb

## 2024-11-16 DIAGNOSIS — E042 Nontoxic multinodular goiter: Secondary | ICD-10-CM

## 2024-11-16 NOTE — Progress Notes (Signed)
 11/16/2024, 4:24 PM   Endocrinology follow-up note  Subjective:    Patient ID: Michelle Maxwell, female    DOB: [birth year], PCP Teresa Channel, MD   Past Medical History:  Diagnosis Date   Bilateral groin pain 06/30/2016   Bloating 06/19/2013   Constipation 06/19/2013   Fibroid 07/06/2016   GERD (gastroesophageal reflux disease)    Hematuria 10/15/2015   Hot flashes 12/25/2014   Missed periods 12/25/2014   Perimenopause 12/25/2014   Thyroid  nodule 06/19/2013   Trichomonal vaginitis 10/15/2015   Vaginal discharge 10/15/2015   Vaginal itching 10/15/2015   Vaginal odor 10/15/2015   Past Surgical History:  Procedure Laterality Date   CESAREAN SECTION     COLONOSCOPY N/A 06/30/2017   Procedure: COLONOSCOPY;  Surgeon: Harvey Margo CROME, MD;  Location: [facility];  Service: Endoscopy;  Laterality: N/A;  1:00PM   ESOPHAGOGASTRODUODENOSCOPY  02/2009   Rehman: small sliding hiatal hernia   WISDOM TOOTH EXTRACTION     Social History   Socioeconomic History   Marital status: Married    Spouse name: Not on file   Number of children: 1   Years of education: Not on file   Highest education level: Not on file  Occupational History   Not on file  Tobacco Use   Smoking status: Never   Smokeless tobacco: Never  Vaping Use   Vaping status: Never Used  Substance and Sexual Activity   Alcohol use: Not Currently    Comment: occassionally   Drug use: No   Sexual activity: Yes    Birth control/protection: Other-see comments, Post-menopausal    Comment: vasectomy  Other Topics Concern   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 HOBBIES: WATCH TV, SURF THE WEB, SOME READING.   Social Drivers of Health   Tobacco Use: Low Risk (11/16/2024)   Patient History    Smoking Tobacco Use: Never    Smokeless Tobacco Use: Never    Passive Exposure: Not on file  Financial  Resource Strain: Low Risk (01/10/2024)   Overall Financial Resource Strain (CARDIA)    Difficulty of Paying Living Expenses: Not hard at all  Food Insecurity: No Food Insecurity (01/10/2024)   Hunger Vital Sign    Worried About Running Out of Food in the Last Year: Never true    Ran Out of Food in the Last Year: Never true  Transportation Needs: No Transportation Needs (01/10/2024)   PRAPARE - Administrator, Civil Service (Medical): No    Lack of Transportation (Non-Medical): No  Physical Activity: Insufficiently Active (01/10/2024)   Exercise Vital Sign    Days of Exercise per Week: 2 days    Minutes of Exercise per Session: 20 min  Stress: No Stress Concern Present (01/10/2024)   [employer] of Occupational Health - Occupational Stress Questionnaire    Feeling of Stress : Only a little  Social Connections: Socially Integrated (01/10/2024)   Social Connection and Isolation Panel    Frequency of Communication with Friends and Family: More than three times a week    Frequency of Social Gatherings with Friends and Family: Twice a week    Attends Religious Services: More than  4 times per year    Active Member of Clubs or Organizations: Yes    Attends Banker Meetings: More than 4 times per year    Marital Status: Married  Depression (PHQ2-9): Low Risk (01/10/2024)   Depression (PHQ2-9)    PHQ-2 Score: 3  Alcohol Screen: Low Risk (01/10/2024)   Alcohol Screen    Last Alcohol Screening Score (AUDIT): 0  Housing: Low Risk (01/10/2024)   Housing Stability Vital Sign    Unable to Pay for Housing in the Last Year: No    Number of Times Moved in the Last Year: 0    Homeless in the Last Year: No  Utilities: Not At Risk (01/10/2024)   AHC Utilities    Threatened with loss of utilities: No  Health Literacy: Not on file   Family History  Problem Relation Age of Onset   Cancer Mother 90       breast   Cancer Father        brain   Colon cancer Neg Hx     Outpatient Encounter Medications as of 11/16/2024  Medication Sig   cholecalciferol (VITAMIN D) 1000 UNITS tablet Take 2,000 Units by mouth daily.   esomeprazole (NEXIUM) 20 MG packet Take 40 mg by mouth daily before breakfast.   naproxen sodium (ALEVE) 220 MG tablet 1 tablet as needed Orally every 12 hrs (Patient taking differently: Take 220 mg by mouth as needed.)   Polyethylene Glycol 3350  (MIRALAX PO) daily.   Probiotic Product (PROBIOTIC PO) Take by mouth.   No facility-administered encounter medications on file as of 11/16/2024.   ALLERGIES: No Known Allergies  VACCINATION STATUS: Immunization History  Administered Date(s) Administered   Influenza,inj,Quad PF,6+ Mos 09/08/2020    HPI Michelle Maxwell is 53 y.o. female who presents today with a medical history as above. she is being seen in follow-up after she was seen in consultation for multinodular goiter requested by Teresa Channel, MD.    She was previously seen in this clinic between 2018 and 2024 in consultation for multinodular goiter.  She was sent for repeat thyroid  ultrasound which showed similar findings of stable nodules bilaterally-see ultrasound summarized below.  - She underwent fine-needle aspiration biopsy in December 2013 which showed non-neoplastic goiter. -She did not require any thyroid  intervention over the years.   Her previsit thyroid  function tests are consistent with euthyroid state.  She has no new complaints today.  - She denies dysphagia, shortness of breath, nor voice change. She denies any exposure to neck radiation. She denies any family history of thyroid  cancer.  She has no new complaints today.  She has a steady weight.    Review of Systems  Constitutional: +mildly fluctuating body weight  no fatigue, no subjective hyperthermia, no subjective hypothermia Eyes: no blurry vision, no xerophthalmia ENT: no sore throat, no nodules palpated in throat, no dysphagia/odynophagia, no  hoarseness   Objective:       11/16/2024    4:03 PM 01/10/2024    3:42 PM 11/17/2023    8:53 AM  Vitals with BMI  Height 5' 1 5' 1 5' 1  Weight 180 lbs 6 oz 178 lbs 176 lbs  BMI 34.1 33.65 33.27  Systolic 110 111 871  Diastolic 72 69 74  Pulse 82 80 84    BP 110/72 (BP Location: Left Arm, Patient Position: Sitting, Cuff Size: Large)   Pulse 82   Ht 5' 1 (1.549 m)   Wt 180 lb 6.4 oz (81.8 kg)  LMP 05/31/2018   BMI 34.09 kg/m   Wt Readings from Last 3 Encounters:  11/16/24 180 lb 6.4 oz (81.8 kg)  01/10/24 178 lb (80.7 kg)  11/17/23 176 lb (79.8 kg)    Physical Exam  Constitutional:  Body mass index is 34.09 kg/m.,  not in acute distress, normal state of mind Eyes: PERRLA, EOMI, no exophthalmos ENT: moist mucous membranes, +gross palpable thyroid ,  no gross cervical lymphadenopathy   CMP ( most recent) CMP     Component Value Date/Time   NA 137 06/19/2013 1445   K 5.1 06/19/2013 1445   CL 104 06/19/2013 1445   CO2 30 06/19/2013 1445   GLUCOSE 72 06/19/2013 1445   BUN 7 06/19/2013 1445   CREATININE 0.80 06/19/2013 1445   CALCIUM 9.7 06/19/2013 1445   PROT 7.3 06/19/2013 1445   ALBUMIN 4.1 06/19/2013 1445   AST 16 06/19/2013 1445   ALT 17 06/19/2013 1445   ALKPHOS 76 06/19/2013 1445   BILITOT 0.4 06/19/2013 1445       Lipid Panel ( most recent) Lipid Panel     Component Value Date/Time   CHOL 138 06/19/2013 1445   TRIG 38 06/19/2013 1445   HDL 52 06/19/2013 1445   CHOLHDL 2.7 06/19/2013 1445   VLDL 8 06/19/2013 1445   LDLCALC 78 06/19/2013 1445      Lab Results  Component Value Date   TSH 3.230 11/10/2024   TSH 2.530 10/06/2023   TSH 1.64 08/04/2019   TSH 4.120 06/30/2018   TSH 2.58 06/28/2017   FREET4 1.06 11/10/2024   FREET4 1.07 10/06/2023   FREET4 1.0 08/04/2019   FREET4 1.24 06/30/2018   FREET4 1.1 06/28/2017      Her last thyroid  ultrasound from July 26, 2019 shows the right lobe measures 4.8 cm unchanged, with a nodule  measuring 1.2 cm previously biopsied when it was 1.1 cm.  Left lobe measured 4.2 cm (previously 3.8 cm) with 1.2 cm nodule, previously 0.9 cm.   Repeat thyroid  ultrasound on November 10, 2023 CLINICAL DATA:  Goiter.  Prior biopsy of right-sided thyroid  nodule     COMPARISON:  Multiple prior thyroid  ultrasounds including 07/26/2019 and 12/26/2014   FINDINGS: Parenchymal Echotexture: Mildly heterogenous   Isthmus: 0.3 cm   Right lobe: 4.6 x 1.6 x 1.8 cm, left lobe: 3.6 x 1.3 x 1.6 cm   _________________________________________________________   Estimated total number of nodules >/= 1 cm: 2   Number of spongiform nodules >/=  2 cm not described below (TR1): 0   Number of mixed cystic and solid nodules >/= 1.5 cm not described below (TR2): 0   _________________________________________________________   Nodule # 1: Continued stability of isoechoic solid nodule in the right lower gland at a maximum of 1.1 cm. Given size (<1.4 cm) and appearance, this nodule does NOT meet TI-RADS criteria for biopsy or dedicated follow-up.   Nodule # 2: Greater than 5 year stability of hypoechoic solid nodule in the left lower gland with a maximal diameter of 1.0 cm. Greater than 5 years of stability is consistent with benignity.   IMPRESSION: Overall stable appearance of the thyroid  gland with small nodules bilaterally which demonstrate no interval change across multiple prior studies for more than 5 years consistent with benignity.   No new nodules or suspicious features.   The above is in keeping with the ACR TI-RADS recommendations - J Am Coll Radiol 2017;14:587-595.     Electronically Signed   By: Wilkie Karalee HERO.D.  On: 11/11/2023 07:41   Assessment & Plan:   1. Multinodular goiter   - I have reviewed her  new and available thyroid  records and clinically evaluated the patient. - Based on these reviews, she has stable longstanding multinodular goiter with euthyroid  presentation.  She did have prior negative fine-needle aspiration biopsy.  Her thyroid  sonograms between 2013 and 2024 are reassuring. She will not need any intervention with surgery or thyroid  hormone at this time.  Her previsit thyroid  function tests remain consistent with euthyroid state.   -She will return to clinic only as needed.  She may need TSH/free T4 during her annual physical at her PCP. - I did not initiate any new prescriptions today. - she is advised to maintain close follow up with Teresa Channel, MD for primary care needs.  I spent  20  minutes in the care of the patient today including review of labs from Thyroid  Function, CMP, and other relevant labs ; imaging/biopsy records (current and previous including abstractions from other facilities); face-to-face time discussing  her lab results and symptoms, medications doses, her options of short and long term treatment based on the latest standards of care / guidelines;   and documenting the encounter.  Michelle Maxwell  participated in the discussions, expressed understanding, and voiced agreement with the above plans.  All questions were answered to her satisfaction. she is encouraged to contact clinic should she have any questions or concerns prior to her return visit.  Follow up plan: Return if symptoms worsen or fail to improve.   Ranny Earl, MD [facility] [address] [place], [place] [address] Phone: [phone]  Fax: [phone]     11/16/2024, 4:24 PM  This note was partially dictated with voice recognition software. Similar sounding words can be transcribed inadequately or may not  be corrected upon review.
